# Patient Record
Sex: Male | Born: 1975
Health system: Southern US, Community
[De-identification: ages and names within clinical notes are randomized; demographics above are authoritative.]

## PROBLEM LIST (undated history)

## (undated) DIAGNOSIS — I10 Essential (primary) hypertension: Secondary | ICD-10-CM

---
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
## 2008-12-04 ENCOUNTER — Encounter: Payer: Self-pay | Admitting: Internal Medicine

## 2008-12-20 ENCOUNTER — Emergency Department (HOSPITAL_COMMUNITY): Admission: EM | Admit: 2008-12-20 | Discharge: 2008-12-20 | Payer: Self-pay | Admitting: Emergency Medicine

## 2012-03-05 ENCOUNTER — Encounter (HOSPITAL_COMMUNITY): Payer: Self-pay

## 2012-03-05 ENCOUNTER — Emergency Department (HOSPITAL_COMMUNITY)
Admission: EM | Admit: 2012-03-05 | Discharge: 2012-03-05 | Disposition: A | Discharge status: Home / Self Care | Payer: Self-pay | Source: Home / Self Care

## 2012-03-05 DIAGNOSIS — I10 Essential (primary) hypertension: Secondary | ICD-10-CM

## 2012-03-05 DIAGNOSIS — F17200 Nicotine dependence, unspecified, uncomplicated: Secondary | ICD-10-CM

## 2012-03-05 DIAGNOSIS — R079 Chest pain, unspecified: Secondary | ICD-10-CM

## 2012-03-05 HISTORY — DX: Essential (primary) hypertension: I10

## 2012-03-05 LAB — BASIC METABOLIC PANEL
CO2: 31 mEq/L (ref 19–32)
Calcium: 8.8 mg/dL (ref 8.4–10.5)
Creatinine, Ser: 1.21 mg/dL (ref 0.50–1.35)
Glucose, Bld: 82 mg/dL (ref 70–99)

## 2012-03-05 MED ORDER — HYDROCHLOROTHIAZIDE 25 MG PO TABS: 25.0000 mg | ORAL_TABLET | Freq: Every day | ORAL | Status: DC

## 2012-03-05 NOTE — ED Provider Notes (Signed)
History    CSN: [id number]  Arrival date & time 03/05/12  1104  Chief Complaint  Patient presents with  . Chest Pain    HTN  . Medication Refill   The history is provided by the patient. No language interpreter was used.   Pt reports that he has been having intermittent chest pain for the past 6 days.  The patient reports that he is worse with coughing and with taking deep breaths.  He reports no radiation.  It's in the center of his chest.  The patient reports that he is a smoker.  The patient reports that he has been having this intermittent chest pain for years.  The patient reports that he has been out of his blood pressure medication for about 2 weeks.  He reports that he was not able to continue following with his primary care provider without having medical insurance.  The patient reports that he has no nausea vomiting or acid reflux symptoms at this time.  He does report that he has frequent belching episodes.  The patient denies current headaches.  Past Medical History  Diagnosis Date  . Hypertension     Past Surgical History  Procedure Date  . Appendectomy     No family history on file.  History  Substance Use Topics  . Smoking status: Current Every Day Smoker  . Smokeless tobacco: Not on file  . Alcohol Use: No    Review of Systems  Constitutional: Negative.   HENT: Negative.   Eyes: Negative.   Respiratory: Negative.   Cardiovascular: Positive for chest pain. Negative for palpitations and leg swelling.  Gastrointestinal: Negative.   Genitourinary: Negative.   Musculoskeletal: Negative.        Cramping in feet  Neurological: Negative.   Hematological: Negative.     Allergies  Review of patient's allergies indicates no known allergies.  Home Medications   Current Outpatient Rx  Name  Route  Sig  Dispense  Refill  . HYDROCHLOROTHIAZIDE 25 MG PO TABS   Oral   Take 25 mg by mouth daily.           BP 135/107  Pulse 72  Temp 98.3 F (36.8 C)  (Oral)  Resp 19  SpO2 100%  Physical Exam  Nursing note and vitals reviewed. Constitutional: He is oriented to person, place, and time. He appears well-developed and well-nourished. No distress.  HENT:  Head: Normocephalic and atraumatic.  Eyes: EOM are normal. Pupils are equal, round, and reactive to light.  Neck: Normal range of motion. Neck supple. No JVD present. No thyromegaly present.  Cardiovascular: Normal rate, regular rhythm and normal heart sounds.   No murmur heard. Pulmonary/Chest: Effort normal and breath sounds normal. No respiratory distress. He has no wheezes. He has no rales. He exhibits tenderness.       Reproducible chest pain when palpating anterior chest wall  Abdominal: Soft. Bowel sounds are normal. He exhibits no distension.  Musculoskeletal: Normal range of motion. He exhibits no edema and no tenderness.  Lymphadenopathy:    He has no cervical adenopathy.  Neurological: He is alert and oriented to person, place, and time.  Skin: Skin is warm and dry.  Psychiatric: He has a normal mood and affect. His behavior is normal. Judgment and thought content normal.    ED Course  Procedures (including critical care time)  Labs Reviewed - No data to display No results found.  No diagnosis found.   MDM  IMPRESSION  Hypertension  Chest pain  Chronic tobacco use  Pleuritic-type chest pain  Muscle cramping, possibly from hypokalemia  cough  RECOMMENDATIONS / PLAN  I would like to check the patient's BMP to make sure he is not having hypokalemia related to hydrochlorothiazide 25 mg.  The patient says that his blood pressure was fairly stable when he's been taking it but he had poor followup with his primary care physician.  I strongly encouraged tobacco cessation.  In addition, recommended OTC NSAIDs for pleuritic-type chest pain.  Reviewed EKG.  Will follow BMP results.    FOLLOW UP One month for blood pressure recheck  The patient was given clear  instructions to go to ER or return to medical center if symptoms don't improve, worsen or new problems develop.  The patient verbalized understanding.  The patient was told to call to get lab results if they haven't heard anything in the next week.            Cleora Fleet, MD 03/05/12 1318

## 2012-03-05 NOTE — ED Notes (Signed)
Patient c/o chest pain while sitting in waiting room. Patient also states needs refill on medication

## 2012-03-06 ENCOUNTER — Telehealth (HOSPITAL_COMMUNITY): Payer: Self-pay | Admitting: Family Medicine

## 2012-03-06 NOTE — ED Notes (Signed)
Patient called today stating that he lost his RX.  Chart reviewed by Dr Lorenz Coaster.  He stated we could call the RX in for the patient.  Patient requesting RX to be called to [facility] on [address].  Message with RX left on voicemail

## 2012-03-18 ENCOUNTER — Telehealth (HOSPITAL_COMMUNITY): Payer: Self-pay

## 2012-03-18 NOTE — Progress Notes (Signed)
Quick Note:  Labs came back OK, please notify patient.   Rodney Langton, MD, CDE, FAAFP [facility] [place], [place]   ______

## 2012-03-18 NOTE — Telephone Encounter (Signed)
Message copied by Lestine Mount on Thu Mar 18, 2012  4:01 PM ------      Message from: Cleora Fleet      Created: Thu Mar 18, 2012  9:43 AM       Labs came back OK, please notify patient.             Rodney Langton, MD, CDE, FAAFP      [facility]      [facility]      [place], [place]

## 2012-03-22 NOTE — ED Notes (Signed)
Spoke with patient per dr. Gerald Stabs results ok

## 2012-05-06 ENCOUNTER — Emergency Department (HOSPITAL_COMMUNITY): Payer: No Typology Code available for payment source

## 2012-05-06 ENCOUNTER — Emergency Department (HOSPITAL_COMMUNITY)
Admission: EM | Admit: 2012-05-06 | Discharge: 2012-05-06 | Disposition: A | Discharge status: Home / Self Care | Payer: No Typology Code available for payment source | Attending: Emergency Medicine | Admitting: Emergency Medicine

## 2012-05-06 ENCOUNTER — Encounter (HOSPITAL_COMMUNITY): Payer: Self-pay | Admitting: Emergency Medicine

## 2012-05-06 DIAGNOSIS — I10 Essential (primary) hypertension: Secondary | ICD-10-CM | POA: Insufficient documentation

## 2012-05-06 DIAGNOSIS — IMO0002 Reserved for concepts with insufficient information to code with codable children: Secondary | ICD-10-CM | POA: Insufficient documentation

## 2012-05-06 DIAGNOSIS — M542 Cervicalgia: Secondary | ICD-10-CM | POA: Insufficient documentation

## 2012-05-06 DIAGNOSIS — Y9389 Activity, other specified: Secondary | ICD-10-CM | POA: Insufficient documentation

## 2012-05-06 DIAGNOSIS — F17200 Nicotine dependence, unspecified, uncomplicated: Secondary | ICD-10-CM | POA: Insufficient documentation

## 2012-05-06 DIAGNOSIS — Y92410 Unspecified street and highway as the place of occurrence of the external cause: Secondary | ICD-10-CM | POA: Insufficient documentation

## 2012-05-06 DIAGNOSIS — Z79899 Other long term (current) drug therapy: Secondary | ICD-10-CM | POA: Insufficient documentation

## 2012-05-06 DIAGNOSIS — M549 Dorsalgia, unspecified: Secondary | ICD-10-CM | POA: Insufficient documentation

## 2012-05-06 NOTE — ED Provider Notes (Signed)
Medical screening examination/treatment/procedure(s) were performed by non-physician practitioner and as supervising physician I was immediately available for consultation/collaboration.  Doug Sou, MD 05/06/12 1556
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
## 2012-05-06 NOTE — ED Notes (Signed)
Ambulated independently with steady gait and no pain to bathroom and nurses station
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
## 2012-05-06 NOTE — ED Notes (Signed)
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

## 2012-07-05 ENCOUNTER — Encounter (HOSPITAL_COMMUNITY): Payer: Self-pay | Admitting: Emergency Medicine

## 2012-07-05 ENCOUNTER — Emergency Department (INDEPENDENT_AMBULATORY_CARE_PROVIDER_SITE_OTHER)
Admission: EM | Admit: 2012-07-05 | Discharge: 2012-07-05 | Disposition: A | Discharge status: Home / Self Care | Payer: Self-pay | Source: Home / Self Care | Attending: Family Medicine | Admitting: Family Medicine

## 2012-07-05 DIAGNOSIS — Z202 Contact with and (suspected) exposure to infections with a predominantly sexual mode of transmission: Secondary | ICD-10-CM

## 2012-07-05 NOTE — ED Notes (Signed)
Extensive teaching 

## 2012-07-05 NOTE — ED Notes (Signed)
Patient concerned for std, physician evaluated patient prior to this nurse

## 2012-07-05 NOTE — ED Notes (Signed)
Patient directed to bathroom for specimen

## 2012-07-05 NOTE — ED Notes (Signed)
Patient denies symptoms, patient reports a partner called reporting she was getting treated for gc/chlamydia.reports he had intercourse with this person once and used a condom.

## 2012-07-05 NOTE — ED Notes (Signed)
Dr Artis Flock aware of vital signs.  No further orders

## 2012-07-05 NOTE — ED Provider Notes (Signed)
History     CSN: [id number]  Arrival date & time 07/05/12  1431   First MD Initiated Contact with Patient 07/05/12 1534      Chief Complaint  Patient presents with  . SEXUALLY TRANSMITTED DISEASE    (Consider location/radiation/quality/duration/timing/severity/associated sxs/prior treatment) Patient is a 37 y.o. male presenting with STD exposure. The history is provided by the patient.  Exposure to STD This is a new problem. Episode onset: told today by girlfriend that she tested pos for chlamydia and gc, pt with no sx., wants to be tested. The problem has not changed since onset.   Past Medical History  Diagnosis Date  . Hypertension     Past Surgical History  Procedure Laterality Date  . Appendectomy      No family history on file.  History  Substance Use Topics  . Smoking status: Current Every Day Smoker  . Smokeless tobacco: Not on file  . Alcohol Use: No      Review of Systems  Constitutional: Negative.   Genitourinary: Negative.     Allergies  Review of patient's allergies indicates no known allergies.  Home Medications   Current Outpatient Rx  Name  Route  Sig  Dispense  Refill  . hydrochlorothiazide (HYDRODIURIL) 25 MG tablet   Oral   Take 1 tablet (25 mg total) by mouth daily.   30 tablet   3     BP 168/106  Pulse 73  Temp(Src) 98.1 F (36.7 C) (Oral)  Resp 16  SpO2 100%  Physical Exam  Nursing note and vitals reviewed. Constitutional: He is oriented to person, place, and time. He appears well-developed and well-nourished.  Genitourinary: Penis normal. No penile tenderness. No discharge found.  Neurological: He is alert and oriented to person, place, and time.  Skin: Skin is warm and dry.    ED Course  Procedures (including critical care time)  Labs Reviewed  URINE CYTOLOGY ANCILLARY ONLY   No results found.   1. Possible exposure to STD       MDM          Linna Hoff, MD 07/05/12 [phone]

## 2012-07-15 ENCOUNTER — Emergency Department (HOSPITAL_COMMUNITY)
Admission: EM | Admit: 2012-07-15 | Discharge: 2012-07-15 | Disposition: A | Discharge status: Home / Self Care | Payer: Self-pay | Attending: Emergency Medicine | Admitting: Emergency Medicine

## 2012-07-15 ENCOUNTER — Encounter (HOSPITAL_COMMUNITY): Payer: Self-pay

## 2012-07-15 DIAGNOSIS — F17200 Nicotine dependence, unspecified, uncomplicated: Secondary | ICD-10-CM | POA: Insufficient documentation

## 2012-07-15 DIAGNOSIS — Z760 Encounter for issue of repeat prescription: Secondary | ICD-10-CM | POA: Insufficient documentation

## 2012-07-15 DIAGNOSIS — I10 Essential (primary) hypertension: Secondary | ICD-10-CM | POA: Insufficient documentation

## 2012-07-15 MED ORDER — HYDROCHLOROTHIAZIDE 25 MG PO TABS: 25.0000 mg | ORAL_TABLET | Freq: Every day | ORAL | Status: DC

## 2012-07-15 NOTE — ED Notes (Signed)
Pt. Is here for BP medication refill.  Has stopped taking his bp meds 2 months ago.  He reports that every time he smokes a cigarette he has chest pain.  He reports that his ankles will swell periodically. Pt. Denies any chest pain , sob , n/v at present time. Skin is warm and dry.

## 2012-07-15 NOTE — ED Notes (Signed)
Pt declined EKG. PA notified.

## 2012-07-15 NOTE — ED Provider Notes (Signed)
History    This chart was scribed for non-physician practitioner working with Suzi Roots, MD by Donne Anon, ED Scribe. This patient was seen in room TR04C/TR04C and the patient's care was started at 1530.   CSN: [id number]  Arrival date & time 07/15/12  1418   First MD Initiated Contact with Patient 07/15/12 1530      Chief Complaint  Patient presents with  . Medication Refill    The history is provided by the patient. No language interpreter was used.   Marc Mata is a 37 y.o. male who presents to the Emergency Department needing Hydrochlorothiazide medication refill (25 mg/day). He states he ran out of his medication 1 month ago. He normally gets it filled by [facility] (an urgent care provider), but he states he has been having problems with his orange card. He reports associated gradual onset, constant, non radiating, sharp left sided CP which lasted for 3 days which was worse when he smokes. The CP resolved itself yesterday; denies CP at this time. He further denies fever, jaw pain, vision changes, numbness, tingling, nausea, vomiting, difficulty breathing or any other pain.  Pt is a current everyday smoker but denies alcohol use.   Past Medical History  Diagnosis Date  . Hypertension     Past Surgical History  Procedure Laterality Date  . Appendectomy      No family history on file.  History  Substance Use Topics  . Smoking status: Current Every Day Smoker  . Smokeless tobacco: Not on file  . Alcohol Use: No     Review of Systems  Constitutional: Negative for fever.  Eyes: Negative for visual disturbance.  Respiratory: Negative for shortness of breath.   Cardiovascular: Negative for chest pain.  Gastrointestinal: Negative for nausea, vomiting and abdominal pain.  Skin: Negative for color change and pallor.  Neurological: Negative for dizziness, syncope, light-headedness and numbness.  All other systems reviewed and are negative.    Allergies   Review of patient's allergies indicates no known allergies.  Home Medications   Current Outpatient Rx  Name  Route  Sig  Dispense  Refill  . Multiple Vitamin (MULTIVITAMIN WITH MINERALS) TABS   Oral   Take 1 tablet by mouth daily.         . hydrochlorothiazide (HYDRODIURIL) 25 MG tablet   Oral   Take 1 tablet (25 mg total) by mouth daily.   30 tablet   1     BP 124/81  Pulse 73  Temp(Src) 98.9 F (37.2 C) (Oral)  Resp 16  SpO2 100%  Physical Exam  Nursing note and vitals reviewed. Constitutional: He is oriented to person, place, and time. He appears well-developed and well-nourished. No distress.  HENT:  Head: Normocephalic and atraumatic.  Mouth/Throat: Oropharynx is clear and moist. No oropharyngeal exudate.  Eyes: Conjunctivae and EOM are normal. Pupils are equal, round, and reactive to light. No scleral icterus.  Neck: Normal range of motion. Neck supple. No tracheal deviation present.  Cardiovascular: Normal rate, regular rhythm, normal heart sounds and intact distal pulses.   Pulmonary/Chest: Effort normal and breath sounds normal. No respiratory distress. He has no wheezes. He has no rales.  Abdominal: Soft. He exhibits no distension. There is no tenderness.  Musculoskeletal: Normal range of motion.  Lymphadenopathy:    He has no cervical adenopathy.  Neurological: He is alert and oriented to person, place, and time. He has normal reflexes.  Skin: Skin is warm and dry. No rash noted.  No erythema. No pallor.  Psychiatric: He has a normal mood and affect. His behavior is normal.    ED Course  Procedures (including critical care time) DIAGNOSTIC STUDIES: Oxygen Saturation is 100% on room air, normal by my interpretation.    COORDINATION OF CARE: 3:31 PM Discussed treatment plan which includes medication refill and EKG with pt at bedside and pt agreed to plan. Will provide pt with a PCP resource guide.  3:56 PM Pt refused EKG. Requests d/c.   Labs  Reviewed - No data to display No results found.   1. Medication refill   2. Hypertension      MDM  Patient presents for medication refill of his HCTZ as he has been out of his medication x 1 month. Patient admits to being asymptomatic at this time; on physical exam heart RRR, lungs CTA, and patient neurovascularly intact as well as hemodynamically stable. EKG recommended given spontaneously resolved, constant, aching, chest pain x 3 days which patient initially agreed to and later refused. Low suspicion for ACS as cause of patient's resolved CP given atypical nature of symptoms, however. Patient d/c with PCP follow up for further evaluation of hypertension. Indications for ED return discussed. Patient states comfort and understanding with this d/c plan with no unaddressed concerns.  I personally performed the services described in this documentation, which was scribed in my presence. The recorded information has been reviewed and is accurate.          Antony Madura, PA-C 07/17/12 2153

## 2012-07-20 NOTE — ED Provider Notes (Signed)
Medical screening examination/treatment/procedure(s) were performed by non-physician practitioner and as supervising physician I was immediately available for consultation/collaboration.   Ravis Herne E Sydna Brodowski, MD 07/20/12 1358 

## 2012-10-06 ENCOUNTER — Emergency Department (HOSPITAL_COMMUNITY)
Admission: EM | Admit: 2012-10-06 | Discharge: 2012-10-06 | Disposition: A | Discharge status: Home / Self Care | Payer: Self-pay | Attending: Emergency Medicine | Admitting: Emergency Medicine

## 2012-10-06 ENCOUNTER — Encounter (HOSPITAL_COMMUNITY): Payer: Self-pay | Admitting: *Deleted

## 2012-10-06 ENCOUNTER — Emergency Department (HOSPITAL_COMMUNITY): Payer: Self-pay

## 2012-10-06 DIAGNOSIS — R0789 Other chest pain: Secondary | ICD-10-CM | POA: Insufficient documentation

## 2012-10-06 DIAGNOSIS — I10 Essential (primary) hypertension: Secondary | ICD-10-CM

## 2012-10-06 DIAGNOSIS — M7989 Other specified soft tissue disorders: Secondary | ICD-10-CM

## 2012-10-06 DIAGNOSIS — Z79899 Other long term (current) drug therapy: Secondary | ICD-10-CM | POA: Insufficient documentation

## 2012-10-06 DIAGNOSIS — F17200 Nicotine dependence, unspecified, uncomplicated: Secondary | ICD-10-CM | POA: Insufficient documentation

## 2012-10-06 LAB — BASIC METABOLIC PANEL
BUN: 14 mg/dL (ref 6–23)
CO2: 29 mEq/L (ref 19–32)
Calcium: 8.7 mg/dL (ref 8.4–10.5)
Creatinine, Ser: 1.21 mg/dL (ref 0.50–1.35)
GFR calc Af Amer: 88 mL/min — ABNORMAL LOW (ref >90)

## 2012-10-06 LAB — CBC
MCH: 30.2 pg (ref 26.0–34.0)
MCV: 83.8 fL (ref 78.0–100.0)
Platelets: 187 10*3/uL (ref 150–400)
RDW: 13.3 % (ref 11.5–15.5)

## 2012-10-06 LAB — PRO B NATRIURETIC PEPTIDE: Pro B Natriuretic peptide (BNP): 69.1 pg/mL (ref 0–125)

## 2012-10-06 MED ORDER — HYDROCHLOROTHIAZIDE 25 MG PO TABS: 25.0000 mg | ORAL_TABLET | Freq: Every day | ORAL | Status: DC

## 2012-10-06 MED ORDER — HYDROCHLOROTHIAZIDE 25 MG PO TABS
25.0000 mg | ORAL_TABLET | Freq: Once | ORAL | Status: AC
  Administered 2012-10-06: 25 mg via ORAL
  Filled 2012-10-06: qty 1

## 2012-10-06 NOTE — ED Notes (Signed)
Meal tray and drink given per MD

## 2012-10-06 NOTE — ED Notes (Addendum)
Pt. Having dependent edema, ankle swelling for 5 days. Suppose to be on bp meds; out of bp meds for 12 days. Minor lt. Sided cp, mild sob. Swelling goes down with leg elevation. Does smoke cigs and much marijuana. Felt hands lock up..worked out today.

## 2012-10-06 NOTE — ED Notes (Signed)
Pt denies any questions or pain upon discharge. 
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
## 2012-10-30 ENCOUNTER — Emergency Department (HOSPITAL_COMMUNITY)
Admission: EM | Admit: 2012-10-30 | Discharge: 2012-10-30 | Disposition: A | Discharge status: Home / Self Care | Payer: Self-pay | Attending: Emergency Medicine | Admitting: Emergency Medicine

## 2012-10-30 ENCOUNTER — Encounter (HOSPITAL_COMMUNITY): Payer: Self-pay | Admitting: *Deleted

## 2012-10-30 DIAGNOSIS — F17200 Nicotine dependence, unspecified, uncomplicated: Secondary | ICD-10-CM | POA: Insufficient documentation

## 2012-10-30 DIAGNOSIS — I10 Essential (primary) hypertension: Secondary | ICD-10-CM | POA: Insufficient documentation

## 2012-10-30 DIAGNOSIS — H9209 Otalgia, unspecified ear: Secondary | ICD-10-CM | POA: Insufficient documentation

## 2012-10-30 DIAGNOSIS — K0889 Other specified disorders of teeth and supporting structures: Secondary | ICD-10-CM

## 2012-10-30 DIAGNOSIS — K089 Disorder of teeth and supporting structures, unspecified: Secondary | ICD-10-CM | POA: Insufficient documentation

## 2012-10-30 MED ORDER — HYDROCODONE-ACETAMINOPHEN 5-325 MG PO TABS
2.0000 | ORAL_TABLET | Freq: Once | ORAL | Status: AC
  Administered 2012-10-30: 2 via ORAL
  Filled 2012-10-30: qty 2

## 2012-10-30 MED ORDER — HYDROCODONE-ACETAMINOPHEN 5-325 MG PO TABS: 2.0000 | ORAL_TABLET | Freq: Four times a day (QID) | ORAL | Status: DC | PRN

## 2012-10-30 MED ORDER — PENICILLIN V POTASSIUM 500 MG PO TABS: 500.0000 mg | ORAL_TABLET | Freq: Four times a day (QID) | ORAL | Status: DC

## 2012-10-30 NOTE — Progress Notes (Signed)
Met Patient at bedside,role of case manager explained to patient.Patient reports his understanding.Patient verbalizes he has increased dental pain and that he does not have a PCP.Patient  Provided with resource sheet for [place]dental department,and also a resource sheet for the [facility] on [address].Patient provided with resources for the  [place] [facility] work- orange card patient reports he will follow up next week.No further case management needs identified.

## 2012-10-30 NOTE — ED Provider Notes (Signed)
History  This chart was scribed for non-physician practitioner, Felipa Furnace, working with Gavin Pound. Oletta Lamas, MD by Ardeen Jourdain, ED Scribe. This patient was seen in room TR05C/TR05C and the patient's care was started at 1556.  CSN: [id number]     Arrival date & time 10/30/12  1528   First MD Initiated Contact with Patient 10/30/12 1556     Chief Complaint  Patient presents with  . Dental Pain    The history is provided by the patient. No language interpreter was used.    HPI Comments: Marc Mata is a 37 y.o. male who presents to the Emergency Department complaining of gradual onset, gradually worsening, constant dental pain from multiple teeth with associated ear pain. He states the pain began a few months ago. He denies taking anything for the pain. He states cold and heat aggravate the pain. He denies seeing a dentist for the symptoms.    Past Medical History  Diagnosis Date  . Hypertension    Past Surgical History  Procedure Laterality Date  . Appendectomy     History reviewed. No pertinent family history. History  Substance Use Topics  . Smoking status: Current Every Day Smoker  . Smokeless tobacco: Not on file  . Alcohol Use: No    Review of Systems  HENT: Positive for dental problem.   All other systems reviewed and are negative.    Allergies  Review of patient's allergies indicates no known allergies.  Home Medications   Current Outpatient Rx  Name  Route  Sig  Dispense  Refill  . hydrochlorothiazide (HYDRODIURIL) 25 MG tablet   Oral   Take 25 mg by mouth daily.         Marland Kitchen HYDROcodone-acetaminophen (NORCO/VICODIN) 5-325 MG per tablet   Oral   Take 2 tablets by mouth every 6 (six) hours as needed for pain.   15 tablet   0   . penicillin v potassium (VEETID) 500 MG tablet   Oral   Take 1 tablet (500 mg total) by mouth 4 (four) times daily.   40 tablet   0    Triage Vitals: BP 137/79  Pulse 76  Temp(Src) 98.6 F (37 C) (Oral)  Resp  20  SpO2 96%  Physical Exam  Nursing note and vitals reviewed. Constitutional: He is oriented to person, place, and time. He appears well-developed and well-nourished. No distress.  HENT:  Head: Normocephalic and atraumatic.  Right Ear: Tympanic membrane, external ear and ear canal normal.  Left Ear: Tympanic membrane, external ear and ear canal normal.  Mouth/Throat:    Poor dentition throughout.  Affected tooth as diagrammed.  No signs of peritonsillar or tonsillar abscess.  No signs of gingival abscess. Oropharynx is clear and without exudates.  Uvula is midline.  Airway is intact. No signs of Ludwig's angina.   Eyes: EOM are normal.  Neck: Neck supple. No tracheal deviation present.  Cardiovascular: Normal rate.   Pulmonary/Chest: Effort normal. No respiratory distress.  Musculoskeletal: Normal range of motion.  Neurological: He is alert and oriented to person, place, and time.  Skin: Skin is warm and dry.  Psychiatric: He has a normal mood and affect. His behavior is normal.    ED Course   Procedures (including critical care time)  DIAGNOSTIC STUDIES: Oxygen Saturation is 96% on room air, adequate by my interpretation.    COORDINATION OF CARE:  4:50 PM-Discussed treatment plan which includes antibiotics, pain medication and follow up with a dentist with pt at  bedside and pt agreed to plan.     Labs Reviewed - No data to display No results found. 1. Pain, dental     MDM  Patient with toothache.  No gross abscess.  Exam unconcerning for Ludwig's angina or spread of infection.  Will treat with penicillin and pain medicine.  Urged patient to follow-up with dentist.      I personally performed the services described in this documentation, which was scribed in my presence. The recorded information has been reviewed and is accurate.     Roxy Horseman, PA-C 10/30/12 1704

## 2012-10-30 NOTE — ED Notes (Signed)
Pt c/o upper right dental pain. States now he has pain up into right ear. States he doesn't remember how long he has had this pain.

## 2012-10-30 NOTE — ED Provider Notes (Signed)
Medical screening examination/treatment/procedure(s) were performed by non-physician practitioner and as supervising physician I was immediately available for consultation/collaboration.   Dannon Perlow Y. Raphaella Larkin, MD 10/30/12 2327 

## 2012-10-30 NOTE — ED Notes (Signed)
Pt reports multiple teeth that are hurting him. Airway intact.

## 2012-12-05 ENCOUNTER — Encounter (HOSPITAL_COMMUNITY): Payer: Self-pay | Admitting: Emergency Medicine

## 2012-12-05 ENCOUNTER — Emergency Department (HOSPITAL_COMMUNITY)
Admission: EM | Admit: 2012-12-05 | Discharge: 2012-12-05 | Disposition: A | Discharge status: Home / Self Care | Payer: Self-pay | Attending: Emergency Medicine | Admitting: Emergency Medicine

## 2012-12-05 DIAGNOSIS — Z760 Encounter for issue of repeat prescription: Secondary | ICD-10-CM | POA: Insufficient documentation

## 2012-12-05 DIAGNOSIS — Z79899 Other long term (current) drug therapy: Secondary | ICD-10-CM | POA: Insufficient documentation

## 2012-12-05 DIAGNOSIS — I10 Essential (primary) hypertension: Secondary | ICD-10-CM | POA: Insufficient documentation

## 2012-12-05 DIAGNOSIS — F17200 Nicotine dependence, unspecified, uncomplicated: Secondary | ICD-10-CM | POA: Insufficient documentation

## 2012-12-05 MED ORDER — HYDROCHLOROTHIAZIDE 25 MG PO TABS: 25.0000 mg | ORAL_TABLET | Freq: Every day | ORAL | Status: DC

## 2012-12-05 NOTE — ED Provider Notes (Signed)
Medical screening examination/treatment/procedure(s) were performed by non-physician practitioner and as supervising physician I was immediately available for consultation/collaboration.   Audree Camel, MD 12/05/12 [phone]

## 2012-12-05 NOTE — ED Provider Notes (Signed)
CSN: [id number]     Arrival date & time 12/05/12  1114 History   First MD Initiated Contact with Patient 12/05/12 1346     Chief Complaint  Patient presents with  . Medication Refill   (Consider location/radiation/quality/duration/timing/severity/associated sxs/prior Treatment) HPI Comments: Patient present for refill of his hydrochlorothiazide. States he has been out of that for one month. States he is working very hard to try to find a primary care provider and get approved for the [employer]. Denies any physical complaints. States he eats well and takes care of himself. Specifically denies chest pain, shortness of breath.  The history is provided by the patient.    Past Medical History  Diagnosis Date  . Hypertension    Past Surgical History  Procedure Laterality Date  . Appendectomy     No family history on file. History  Substance Use Topics  . Smoking status: Current Every Day Smoker  . Smokeless tobacco: Not on file  . Alcohol Use: No    Review of Systems  Respiratory: Negative for shortness of breath.   Cardiovascular: Negative for chest pain.  Musculoskeletal: Negative for back pain.  Neurological: Negative for weakness and numbness.    Allergies  Review of patient's allergies indicates no known allergies.  Home Medications   Current Outpatient Rx  Name  Route  Sig  Dispense  Refill  . hydrochlorothiazide (HYDRODIURIL) 25 MG tablet   Oral   Take 25 mg by mouth daily.         Marland Kitchen HYDROcodone-acetaminophen (NORCO/VICODIN) 5-325 MG per tablet   Oral   Take 2 tablets by mouth every 6 (six) hours as needed for pain.   15 tablet   0   . hydrochlorothiazide (HYDRODIURIL) 25 MG tablet   Oral   Take 1 tablet (25 mg total) by mouth daily.   30 tablet   1    BP 153/95  Pulse 85  Temp(Src) 98.6 F (37 C) (Oral)  Resp 18  Ht 5\' 9"  (1.753 m)  Wt 180 lb (81.647 kg)  BMI 26.57 kg/m2  SpO2 96% Physical Exam  Nursing note and vitals  reviewed. Constitutional: He appears well-developed and well-nourished. No distress.  HENT:  Head: Normocephalic and atraumatic.  Neck: Neck supple.  Pulmonary/Chest: Effort normal.  Neurological: He is alert.  Skin: He is not diaphoretic.    ED Course  Procedures (including critical care time) Labs Review Labs Reviewed - No data to display Imaging Review No results found.  MDM   1. Medication refill   2. Hypertension    Patient consents only for medication refill. He is completely asymptomatic. Discharged home with hydrochlorothiazide her previous prescription. PCP followup. Return precautions given. Patient verbalizes understanding and agrees with plan    Trixie Dredge, PA-C 12/05/12 1412

## 2012-12-05 NOTE — ED Notes (Signed)
Patient called 3x, no answer.

## 2012-12-05 NOTE — ED Notes (Signed)
Pt request med refill for HCTZ. Pt has not taken his medication x 31 days.

## 2012-12-05 NOTE — ED Notes (Signed)
Pt stated that he has been out of his HCTZ for over 30days and needs a refill.

## 2013-03-05 ENCOUNTER — Encounter (HOSPITAL_COMMUNITY): Payer: Self-pay | Admitting: Emergency Medicine

## 2013-03-05 ENCOUNTER — Emergency Department (HOSPITAL_COMMUNITY): Payer: Self-pay

## 2013-03-05 ENCOUNTER — Emergency Department (HOSPITAL_COMMUNITY)
Admission: EM | Admit: 2013-03-05 | Discharge: 2013-03-05 | Disposition: A | Discharge status: Home / Self Care | Payer: Self-pay | Attending: Emergency Medicine | Admitting: Emergency Medicine

## 2013-03-05 DIAGNOSIS — M25473 Effusion, unspecified ankle: Secondary | ICD-10-CM | POA: Insufficient documentation

## 2013-03-05 DIAGNOSIS — R51 Headache: Secondary | ICD-10-CM | POA: Insufficient documentation

## 2013-03-05 DIAGNOSIS — I10 Essential (primary) hypertension: Secondary | ICD-10-CM | POA: Insufficient documentation

## 2013-03-05 DIAGNOSIS — F17200 Nicotine dependence, unspecified, uncomplicated: Secondary | ICD-10-CM | POA: Insufficient documentation

## 2013-03-05 DIAGNOSIS — Z760 Encounter for issue of repeat prescription: Secondary | ICD-10-CM | POA: Insufficient documentation

## 2013-03-05 DIAGNOSIS — M25476 Effusion, unspecified foot: Secondary | ICD-10-CM | POA: Insufficient documentation

## 2013-03-05 DIAGNOSIS — Z79899 Other long term (current) drug therapy: Secondary | ICD-10-CM | POA: Insufficient documentation

## 2013-03-05 DIAGNOSIS — R0789 Other chest pain: Secondary | ICD-10-CM | POA: Insufficient documentation

## 2013-03-05 DIAGNOSIS — H538 Other visual disturbances: Secondary | ICD-10-CM | POA: Insufficient documentation

## 2013-03-05 LAB — BASIC METABOLIC PANEL
BUN: 9 mg/dL (ref 6–23)
CO2: 29 mEq/L (ref 19–32)
Calcium: 8.7 mg/dL (ref 8.4–10.5)
GFR calc Af Amer: >90 mL/min (ref >90)
Glucose, Bld: 90 mg/dL (ref 70–99)
Potassium: 4.3 mEq/L (ref 3.5–5.1)
Sodium: 140 mEq/L (ref 135–145)

## 2013-03-05 LAB — POCT I-STAT TROPONIN I

## 2013-03-05 LAB — CBC
HCT: 42.8 % (ref 39.0–52.0)
Hemoglobin: 15.6 g/dL (ref 13.0–17.0)
MCH: 30.7 pg (ref 26.0–34.0)
MCHC: 36.4 g/dL — ABNORMAL HIGH (ref 30.0–36.0)
RBC: 5.08 MIL/uL (ref 4.22–5.81)

## 2013-03-05 LAB — PRO B NATRIURETIC PEPTIDE: Pro B Natriuretic peptide (BNP): 77.9 pg/mL (ref 0–125)

## 2013-03-05 MED ORDER — ASPIRIN 325 MG PO TABS
325.0000 mg | ORAL_TABLET | Freq: Once | ORAL | Status: AC
  Administered 2013-03-05: 325 mg via ORAL
  Filled 2013-03-05: qty 1

## 2013-03-05 MED ORDER — HYDROCHLOROTHIAZIDE 25 MG PO TABS: 25.0000 mg | ORAL_TABLET | Freq: Every day | ORAL | Status: DC

## 2013-03-05 MED ORDER — HYDROCHLOROTHIAZIDE 25 MG PO TABS
25.0000 mg | ORAL_TABLET | Freq: Every day | ORAL | Status: DC
  Administered 2013-03-05: 25 mg via ORAL
  Filled 2013-03-05: qty 1

## 2013-03-05 NOTE — ED Notes (Signed)
Pt blood pressure remains elevated. Caring PA notified that his BP is still high. PA  is aware of HTN and stated that she will discharge patient. Pt stated that he was going to leave AMA and doctor is aware. Provided pt with discharge instructions. Also, notified pt when  To come back to hospital and s/s of HTN. Pt stated that he is aware.

## 2013-03-05 NOTE — ED Notes (Signed)
Provided member with specimen cup for urine sample. Pt went to the bathroom and came back and stated that he forgot that we needed a sample and did not give [place] a sample. Notified PA.

## 2013-03-05 NOTE — ED Notes (Signed)
Pt refused urine sample. PA made aware and stated that is okay and not needed at this time. PA Okayed to get pt something to eat and drink. Provided pt with [place] sandwich and water.

## 2013-03-05 NOTE — ED Notes (Addendum)
PA at bedside. PA aware of elevated blood pressure.

## 2013-03-05 NOTE — ED Notes (Signed)
Pt stated that he came to the ED because he has been having Chest pain x 4 days. Pain is on both left and right chest. Pain feels like a aching pain. Pain is intermittent. Pt stated that he was SOB about 2 days ago. He stated that SOB went away within 30 minutes- 1 hour. Pt stated that he thinks the pain is related to stress. He stated that he has been extremely stressed lately because of relationship and financial issues. No cardiac or respiratory distress. Will continue to monitor.

## 2013-03-05 NOTE — ED Notes (Signed)
Pt states originally that he is here for medication refill on BP meds and ankle swelling.  Then sits down and states his chest just started hurting and he is a heavy pot smoker
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
## 2013-03-06 NOTE — ED Provider Notes (Signed)
Medical screening examination/treatment/procedure(s) were performed by non-physician practitioner and as supervising physician I was immediately available for consultation/collaboration.  EKG Interpretation    Date/Time:  Saturday March 05 2013 08:56:05 EST Ventricular Rate:  85 PR Interval:  154 QRS Duration: 82 QT Interval:  352 QTC Calculation: 418 R Axis:   82 Text Interpretation:  Normal sinus rhythm Septal infarct , age undetermined No significant change since last tracing Confirmed by [facility]  MD, Toren Tucholski [phone]) on 03/05/2013 11:48:59 AM             Hurman Horn, MD 03/06/13 [phone]

## 2013-03-20 ENCOUNTER — Encounter (HOSPITAL_COMMUNITY): Payer: Self-pay | Admitting: Emergency Medicine

## 2013-03-20 ENCOUNTER — Emergency Department (HOSPITAL_COMMUNITY)
Admission: EM | Admit: 2013-03-20 | Discharge: 2013-03-20 | Disposition: A | Discharge status: Home / Self Care | Payer: Self-pay | Attending: Emergency Medicine | Admitting: Emergency Medicine

## 2013-03-20 DIAGNOSIS — F17200 Nicotine dependence, unspecified, uncomplicated: Secondary | ICD-10-CM | POA: Insufficient documentation

## 2013-03-20 DIAGNOSIS — R300 Dysuria: Secondary | ICD-10-CM | POA: Insufficient documentation

## 2013-03-20 DIAGNOSIS — Z79899 Other long term (current) drug therapy: Secondary | ICD-10-CM | POA: Insufficient documentation

## 2013-03-20 DIAGNOSIS — I10 Essential (primary) hypertension: Secondary | ICD-10-CM | POA: Insufficient documentation

## 2013-03-20 LAB — URINALYSIS, ROUTINE W REFLEX MICROSCOPIC
Ketones, ur: NEGATIVE mg/dL
Nitrite: NEGATIVE
Protein, ur: >300 mg/dL — AB
Specific Gravity, Urine: 1.023 (ref 1.005–1.030)
Urobilinogen, UA: 1 mg/dL (ref 0.0–1.0)

## 2013-03-20 LAB — URINE MICROSCOPIC-ADD ON

## 2013-03-20 MED ORDER — CIPROFLOXACIN HCL 500 MG PO TABS: 500.0000 mg | ORAL_TABLET | Freq: Two times a day (BID) | ORAL | Status: DC

## 2013-03-20 NOTE — ED Notes (Signed)
Social service consult.

## 2013-03-20 NOTE — ED Provider Notes (Signed)
CSN: [id number]     Arrival date & time 03/20/13  6295 History   First MD Initiated Contact with Patient 03/20/13 0945    This chart was scribed for Irish Elders NP, a non-physician practitioner working with Junius Argyle, MD by Lewanda Rife, ED Scribe. This patient was seen in room TR07C/TR07C and the patient's care was started at 10:08 AM     Chief Complaint  Patient presents with  . Urinary Tract Infection   (Consider location/radiation/quality/duration/timing/severity/associated sxs/prior Treatment) The history is provided by the patient. No language interpreter was used.   HPI Comments: Marc Mata is a 37 y.o. male who presents to the Emergency Department complaining of worsening intermittent dysuria onset 2 days. Reports associated red discharge noted after urination. Denies any aggravating or alleviating factors. Denies associated fever, chills, nausea, emesis, diarrhea, back pain, and abdominal pain. Denies new sex partners, and unprotected sex. Denies PMHx of STI.   Past Medical History  Diagnosis Date  . Hypertension    Past Surgical History  Procedure Laterality Date  . Appendectomy     No family history on file. History  Substance Use Topics  . Smoking status: Current Every Day Smoker  . Smokeless tobacco: Not on file  . Alcohol Use: Yes    Review of Systems  Genitourinary: Positive for dysuria.  Psychiatric/Behavioral: Negative for confusion.    Allergies  Review of patient's allergies indicates no known allergies.  Home Medications   Current Outpatient Rx  Name  Route  Sig  Dispense  Refill  . hydrochlorothiazide (HYDRODIURIL) 25 MG tablet   Oral   Take 1 tablet (25 mg total) by mouth daily.   30 tablet   0    BP 137/82  Pulse 93  Temp(Src) 98.8 F (37.1 C) (Oral)  Resp 18  Ht 5\' 9"  (1.753 m)  Wt 178 lb (80.74 kg)  BMI 26.27 kg/m2  SpO2 99% Physical Exam  Nursing note and vitals reviewed. Constitutional: He is oriented to person,  place, and time. He appears well-developed and well-nourished. No distress.  HENT:  Head: Normocephalic and atraumatic.  Eyes: EOM are normal.  Neck: Neck supple. No tracheal deviation present.  Cardiovascular: Normal rate and regular rhythm.   No murmur heard. Pulmonary/Chest: Effort normal and breath sounds normal. No respiratory distress. He has no wheezes. He has no rales.  Abdominal: Soft. There is no tenderness.  No CVA tenderness  Musculoskeletal: Normal range of motion.  Neurological: He is alert and oriented to person, place, and time.  Skin: Skin is warm and dry.  Psychiatric: He has a normal mood and affect. His behavior is normal.    ED Course  Procedures (including critical care time)  COORDINATION OF CARE:  Nursing notes reviewed. Vital signs reviewed. Initial pt interview and examination performed.   9:52 AM-Discussed work up plan with pt at bedside, which includes UA, GC/Chlamydia probe, RPR, and HIV antibody. Pt agrees with plan.  10:33 AM Pt declined blood draw for RPR and HIV antibody. Pt informed of risks and recommendations.   Treatment plan initiated:Medications - No data to display   Initial diagnostic testing ordered.    Labs Review Labs Reviewed  URINALYSIS, ROUTINE W REFLEX MICROSCOPIC - Abnormal; Notable for the following:    APPearance CLOUDY (*)    Hgb urine dipstick SMALL (*)    Protein, ur >300 (*)    Leukocytes, UA SMALL (*)    All other components within normal limits  GC/CHLAMYDIA PROBE AMP  URINE MICROSCOPIC-ADD ON   Imaging Review No results found.  EKG Interpretation   None       MDM   1. Dysuria    Afebrile. Non-toxic in appearance. Pt reports that he is homeless and that he doesn't wish to talk to anyone about help, social worker consult offered. Prescription for Cipro, reports that he has the ability to get prescription. Urine cloudy with leuks, will treat. Sent GC/Chlamydia cultures. Pt agrees to return if symptoms  worsen. No hematuria, back pain, chills or fever. Pt understands and agrees to plan.  I personally performed the services described in this documentation, which was scribed in my presence. The recorded information has been reviewed and is accurate.    Irish Elders, NP 03/25/13 [phone]

## 2013-03-20 NOTE — ED Notes (Signed)
Pt refused labdraw

## 2013-03-20 NOTE — Progress Notes (Signed)
Weekend CSW received call to speak with patient to assess for social needs. When CSW arrived, RN informed CSW that patient did not want to speak with CSW and had left hospital. Please re-consult if further social work needs arise.   Samuella Bruin, MSW, LCSWA Clinical Social Worker [facility] Emergency Dept. [phone]

## 2013-03-20 NOTE — ED Notes (Signed)
Pt had questions about additional resources on discharge, social work paged. Pt left prior to social work but did receive full discharge instructions.

## 2013-03-20 NOTE — ED Notes (Signed)
Pt refused social service consult

## 2013-03-20 NOTE — ED Notes (Signed)
Pt c/o burning with urination and red discharge from penis when wiping after urination.

## 2013-03-21 LAB — GC/CHLAMYDIA PROBE AMP
CT Probe RNA: NEGATIVE
GC Probe RNA: NEGATIVE

## 2013-03-29 NOTE — ED Provider Notes (Signed)
Medical screening examination/treatment/procedure(s) were performed by non-physician practitioner and as supervising physician I was immediately available for consultation/collaboration.  EKG Interpretation   None         Zamyah Wiesman S Chelcea Zahn, MD 03/29/13 1135 

## 2013-04-25 ENCOUNTER — Emergency Department (HOSPITAL_COMMUNITY)
Admission: EM | Admit: 2013-04-25 | Discharge: 2013-04-25 | Disposition: A | Discharge status: Home / Self Care | Payer: Self-pay | Attending: Emergency Medicine | Admitting: Emergency Medicine

## 2013-04-25 DIAGNOSIS — Z79899 Other long term (current) drug therapy: Secondary | ICD-10-CM | POA: Insufficient documentation

## 2013-04-25 DIAGNOSIS — Z5900 Homelessness unspecified: Secondary | ICD-10-CM | POA: Insufficient documentation

## 2013-04-25 DIAGNOSIS — Z792 Long term (current) use of antibiotics: Secondary | ICD-10-CM | POA: Insufficient documentation

## 2013-04-25 DIAGNOSIS — F1410 Cocaine abuse, uncomplicated: Secondary | ICD-10-CM | POA: Insufficient documentation

## 2013-04-25 DIAGNOSIS — I10 Essential (primary) hypertension: Secondary | ICD-10-CM | POA: Insufficient documentation

## 2013-04-25 DIAGNOSIS — F17200 Nicotine dependence, unspecified, uncomplicated: Secondary | ICD-10-CM | POA: Insufficient documentation

## 2013-04-25 LAB — CBC
HCT: 37.7 % — ABNORMAL LOW (ref 39.0–52.0)
HEMOGLOBIN: 13.5 g/dL (ref 13.0–17.0)
MCH: 30.2 pg (ref 26.0–34.0)
MCHC: 35.8 g/dL (ref 30.0–36.0)
MCV: 84.3 fL (ref 78.0–100.0)
Platelets: 134 10*3/uL — ABNORMAL LOW (ref 150–400)
RBC: 4.47 MIL/uL (ref 4.22–5.81)
RDW: 12.7 % (ref 11.5–15.5)
WBC: 4.6 10*3/uL (ref 4.0–10.5)

## 2013-04-25 LAB — BASIC METABOLIC PANEL
BUN: 11 mg/dL (ref 6–23)
CO2: 28 mEq/L (ref 19–32)
Calcium: 7.9 mg/dL — ABNORMAL LOW (ref 8.4–10.5)
Chloride: 104 mEq/L (ref 96–112)
Creatinine, Ser: 1.03 mg/dL (ref 0.50–1.35)
Glucose, Bld: 115 mg/dL — ABNORMAL HIGH (ref 70–99)
Potassium: 4.5 mEq/L (ref 3.7–5.3)
Sodium: 140 mEq/L (ref 137–147)

## 2013-04-25 MED ORDER — HYDRALAZINE HCL 20 MG/ML IJ SOLN
5.0000 mg | Freq: Once | INTRAMUSCULAR | Status: AC
  Administered 2013-04-25: 5 mg via INTRAVENOUS
  Filled 2013-04-25: qty 1

## 2013-04-25 MED ORDER — LABETALOL HCL 5 MG/ML IV SOLN
20.0000 mg | Freq: Once | INTRAVENOUS | Status: AC
  Administered 2013-04-25: 20 mg via INTRAVENOUS
  Filled 2013-04-25: qty 4

## 2013-04-25 MED ORDER — HYDROCHLOROTHIAZIDE 25 MG PO TABS: 25.0000 mg | ORAL_TABLET | Freq: Every day | ORAL | Status: DC

## 2013-04-25 MED ORDER — HYDROCHLOROTHIAZIDE 25 MG PO TABS
25.0000 mg | ORAL_TABLET | ORAL | Status: AC
  Administered 2013-04-25: 25 mg via ORAL
  Filled 2013-04-25: qty 1

## 2013-04-25 MED ORDER — CLONIDINE HCL 0.2 MG PO TABS
0.2000 mg | ORAL_TABLET | Freq: Once | ORAL | Status: AC
  Administered 2013-04-25: 0.2 mg via ORAL
  Filled 2013-04-25: qty 1

## 2013-04-25 NOTE — ED Notes (Signed)
Pt here from home with c/o headache and htn , pt has been out of his meds for 3 months

## 2013-04-25 NOTE — ED Notes (Signed)
Pt now states that his head does not hurt just here to get something for his htn

## 2013-04-25 NOTE — ED Notes (Signed)
Pt. oob to the bathroom gait steady 

## 2013-04-25 NOTE — ED Provider Notes (Signed)
7:36 AM Accepted care from Dr. Lavella LemonsManly. 43M here for BP check. Reported transient HA yesterday, asx today here w/ elevated BP. Labetalol given w/out relief. Clonidine then given.   8:58 AM: BP decreasing appropriately. Pt states he can get the BP meds and will f/u. He remains asx.  I have discussed the diagnosis/risks/treatment options with the patient and believe the pt to be eligible for discharge home to follow-up with and estab w/ a pcp. We also discussed returning to the ED immediately if new or worsening sx occur. We discussed the sx which are most concerning (e.g., HA, blurry vision, dizziness, cp, sob) that necessitate immediate return. Any new prescriptions provided to the patient are listed below.  New Prescriptions   HYDROCHLOROTHIAZIDE (HYDRODIURIL) 25 MG TABLET    Take 1 tablet (25 mg total) by mouth daily.   Clinical Impression 1. Hypertension   2. Cocaine abuse      Junius ArgyleForrest S Avonna Iribe, MD 04/25/13 [phone]

## 2013-04-25 NOTE — Discharge Instructions (Signed)
Arterial Hypertension °Arterial hypertension (high blood pressure) is a condition of elevated pressure in your blood vessels. Hypertension over a long period of time is a risk factor for strokes, heart attacks, and heart failure. It is also the leading cause of kidney (renal) failure.  °CAUSES  °· In Adults -- Over 90% of all hypertension has no known cause. This is called essential or primary hypertension. In the other 10% of people with hypertension, the increase in blood pressure is caused by another disorder. This is called secondary hypertension. Important causes of secondary hypertension are: °· Heavy alcohol use. °· Obstructive sleep apnea. °· Hyperaldosterosim (Conn's syndrome). °· Steroid use. °· Chronic kidney failure. °· Hyperparathyroidism. °· Medications. °· Renal artery stenosis. °· Pheochromocytoma. °· Cushing's disease. °· Coarctation of the aorta. °· Scleroderma renal crisis. °· Licorice (in excessive amounts). °· Drugs (cocaine, methamphetamine). °Your caregiver can explain any items above that apply to you. °· In Children -- Secondary hypertension is more common and should always be considered. °· Pregnancy -- Few women of childbearing age have high blood pressure. However, up to 10% of them develop hypertension of pregnancy. Generally, this will not harm the woman. It may be a sign of 3 complications of pregnancy: preeclampsia, HELLP syndrome, and eclampsia. Follow up and control with medication is necessary. °SYMPTOMS  °· This condition normally does not produce any noticeable symptoms. It is usually found during a routine exam. °· Malignant hypertension is a late problem of high blood pressure. It may have the following symptoms: °· Headaches. °· Blurred vision. °· End-organ damage (this means your kidneys, heart, lungs, and other organs are being damaged). °· Stressful situations can increase the blood pressure. If a person with normal blood pressure has their blood pressure go up while being  seen by their caregiver, this is often termed "white coat hypertension." Its importance is not known. It may be related with eventually developing hypertension or complications of hypertension. °· Hypertension is often confused with mental tension, stress, and anxiety. °DIAGNOSIS  °The diagnosis is made by 3 separate blood pressure measurements. They are taken at least 1 week apart from each other. If there is organ damage from hypertension, the diagnosis may be made without repeat measurements. °Hypertension is usually identified by having blood pressure readings: °· Above 140/90 mmHg measured in both arms, at 3 separate times, over a couple weeks. °· Over 130/80 mmHg should be considered a risk factor and may require treatment in patients with diabetes. °Blood pressure readings over 120/80 mmHg are called "pre-hypertension" even in non-diabetic patients. °To get a true blood pressure measurement, use the following guidelines. Be aware of the factors that can alter blood pressure readings. °· Take measurements at least 1 hour after caffeine. °· Take measurements 30 minutes after smoking and without any stress. This is another reason to quit smoking  it raises your blood pressure. °· Use a proper cuff size. Ask your caregiver if you are not sure about your cuff size. °· Most home blood pressure cuffs are automatic. They will measure systolic and diastolic pressures. The systolic pressure is the pressure reading at the start of sounds. Diastolic pressure is the pressure at which the sounds disappear. If you are elderly, measure pressures in multiple postures. Try sitting, lying or standing. °· Sit at rest for a minimum of 5 minutes before taking measurements. °· You should not be on any medications like decongestants. These are found in many cold medications. °· Record your blood pressure readings and review   them with your caregiver. °If you have hypertension: °· Your caregiver may do tests to be sure you do not have  secondary hypertension (see "causes" above). °· Your caregiver may also look for signs of metabolic syndrome. This is also called Syndrome X or Insulin Resistance Syndrome. You may have this syndrome if you have type 2 diabetes, abdominal obesity, and abnormal blood lipids in addition to hypertension. °· Your caregiver will take your medical and family history and perform a physical exam. °· Diagnostic tests may include blood tests (for glucose, cholesterol, potassium, and kidney function), a urinalysis, or an EKG. Other tests may also be necessary depending on your condition. °PREVENTION  °There are important lifestyle issues that you can adopt to reduce your chance of developing hypertension: °· Maintain a normal weight. °· Limit the amount of salt (sodium) in your diet. °· Exercise often. °· Limit alcohol intake. °· Get enough potassium in your diet. Discuss specific advice with your caregiver. °· Follow a DASH diet (dietary approaches to stop hypertension). This diet is rich in fruits, vegetables, and low-fat dairy products, and avoids certain fats. °PROGNOSIS  °Essential hypertension cannot be cured. Lifestyle changes and medical treatment can lower blood pressure and reduce complications. The prognosis of secondary hypertension depends on the underlying cause. Many people whose hypertension is controlled with medicine or lifestyle changes can live a normal, healthy life.  °RISKS AND COMPLICATIONS  °While high blood pressure alone is not an illness, it often requires treatment due to its short- and long-term effects on many organs. Hypertension increases your risk for: °· CVAs or strokes (cerebrovascular accident). °· Heart failure due to chronically high blood pressure (hypertensive cardiomyopathy). °· Heart attack (myocardial infarction). °· Damage to the retina (hypertensive retinopathy). °· Kidney failure (hypertensive nephropathy). °Your caregiver can explain list items above that apply to you. Treatment  of hypertension can significantly reduce the risk of complications. °TREATMENT  °· For overweight patients, weight loss and regular exercise are recommended. Physical fitness lowers blood pressure. °· Mild hypertension is usually treated with diet and exercise. A diet rich in fruits and vegetables, fat-free dairy products, and foods low in fat and salt (sodium) can help lower blood pressure. Decreasing salt intake decreases blood pressure in a 1/3 of people. °· Stop smoking if you are a smoker. °The steps above are highly effective in reducing blood pressure. While these actions are easy to suggest, they are difficult to achieve. Most patients with moderate or severe hypertension end up requiring medications to bring their blood pressure down to a normal level. There are several classes of medications for treatment. Blood pressure pills (antihypertensives) will lower blood pressure by their different actions. Lowering the blood pressure by 10 mmHg may decrease the risk of complications by as much as 25%. °The goal of treatment is effective blood pressure control. This will reduce your risk for complications. Your caregiver will help you determine the best treatment for you according to your lifestyle. What is excellent treatment for one person, may not be for you. °HOME CARE INSTRUCTIONS  °· Do not smoke. °· Follow the lifestyle changes outlined in the "Prevention" section. °· If you are on medications, follow the directions carefully. Blood pressure medications must be taken as prescribed. Skipping doses reduces their benefit. It also puts you at risk for problems. °· Follow up with your caregiver, as directed. °· If you are asked to monitor your blood pressure at home, follow the guidelines in the "Diagnosis" section above. °SEEK MEDICAL CARE   IF:  °· You think you are having medication side effects. °· You have recurrent headaches or lightheadedness. °· You have swelling in your ankles. °· You have trouble with  your vision. °SEEK IMMEDIATE MEDICAL CARE IF:  °· You have sudden onset of chest pain or pressure, difficulty breathing, or other symptoms of a heart attack. °· You have a severe headache. °· You have symptoms of a stroke (such as sudden weakness, difficulty speaking, difficulty walking). °MAKE SURE YOU:  °· Understand these instructions. °· Will watch your condition. °· Will get help right away if you are not doing well or get worse. °Document Released: 03/24/2005 Document Revised: 06/16/2011 Document Reviewed: 10/22/2006 °Exit[employer]® Patient Information ©2014 [employer]. ° °

## 2013-04-25 NOTE — ED Provider Notes (Addendum)
CSN: [id number]     Arrival date & time 04/25/13  [id number] History   First MD Initiated Contact with Patient 04/25/13 [phone]     Chief Complaint  Patient presents with  . Hypertension   (Consider location/radiation/quality/duration/timing/severity/associated sxs/prior Treatment) HPI This patient is a very pleasant 38 year old homeless man with a history of hypertension who has been without his medications for the past 4 months and has been using cocaine regularly.  He presents to get his blood pressure checked. He said that he had a diffuse headache yesterday which lasted about 5 hours and was moderate in severity. Today, he thought about his headache from yesterday and decided that he should get his blood pressure checked in the emergency department.  He denies any headache at present. He denies any focal neurologic deficits. Visual changes. No chest pain or difficulty breathing./Cocaine use was 48 hours ago, per the patient.  Past Medical History  Diagnosis Date  . Hypertension    Past Surgical History  Procedure Laterality Date  . Appendectomy     No family history on file. History  Substance Use Topics  . Smoking status: Current Every Day Smoker  . Smokeless tobacco: Not on file  . Alcohol Use: Yes    Review of Systems Ten point review of symptoms performed and is negative with the exception of symptoms noted above.   Allergies  Review of patient's allergies indicates no known allergies.  Home Medications   Current Outpatient Rx  Name  Route  Sig  Dispense  Refill  . ciprofloxacin (CIPRO) 500 MG tablet   Oral   Take 1 tablet (500 mg total) by mouth 2 (two) times daily.   28 tablet   0   . hydrochlorothiazide (HYDRODIURIL) 25 MG tablet   Oral   Take 1 tablet (25 mg total) by mouth daily.   30 tablet   0    BP 204/114  Pulse 70  Temp(Src) 98 F (36.7 C) (Oral)  Resp 20  Ht 5\' 8"  (1.727 m)  Wt 170 lb (77.111 kg)  BMI 25.85 kg/m2  SpO2 100% Physical Exam Gen:  well developed and well nourished appearing Head: NCAT Eyes: PERL, EOMI Nose: no epistaixis or rhinorrhea Mouth/throat: mucosa is moist and pink Neck: supple, no stridor Lungs: CTA B, no wheezing, rhonchi or rales CV: RRR, no murmur, extremities appear well perfused.  Abd: soft, notender, nondistended Back: no ttp, no cva ttp Skin: warm and dry Ext: normal to inspection, no dependent edema Neuro: CN ii-xii grossly intact, no focal deficits Psyche; normal affect,  calm and cooperative.   ED Course  Procedures (including critical care time) Labs Review EKG: nsr, no acute ischemic changes, normal intervals, normal axis, normal qrs complex  MDM  Patient will be treated for hypertensive urgency and we will refill his prescription for HCTZ. [facility] the [facility] and suggested that he follow up there. He is counseled re: return precautions.   Brandt LoosenJulie Kerman Pfost, MD 04/25/13 [phone]  [id number]:  No improvement in BP with labetalol 20mg  IV.  We will tx with Clonidine 0.2mg  po and check BMP, CBC, U/A and UDS in anticipation of possible need for admission for hypertensive crisis. However, the patient is adamant about wanting to be discharged.   Brandt LoosenJulie Annetta Deiss, MD 04/25/13 [phone]

## 2013-08-28 ENCOUNTER — Encounter (HOSPITAL_COMMUNITY): Payer: Self-pay | Admitting: Emergency Medicine

## 2013-08-28 ENCOUNTER — Emergency Department (HOSPITAL_COMMUNITY): Payer: Self-pay

## 2013-08-28 ENCOUNTER — Emergency Department (HOSPITAL_COMMUNITY)
Admission: EM | Admit: 2013-08-28 | Discharge: 2013-08-28 | Disposition: A | Discharge status: Home / Self Care | Payer: Self-pay | Attending: Emergency Medicine | Admitting: Emergency Medicine

## 2013-08-28 DIAGNOSIS — F17200 Nicotine dependence, unspecified, uncomplicated: Secondary | ICD-10-CM | POA: Insufficient documentation

## 2013-08-28 DIAGNOSIS — I861 Scrotal varices: Secondary | ICD-10-CM | POA: Insufficient documentation

## 2013-08-28 DIAGNOSIS — F411 Generalized anxiety disorder: Secondary | ICD-10-CM | POA: Insufficient documentation

## 2013-08-28 DIAGNOSIS — R599 Enlarged lymph nodes, unspecified: Secondary | ICD-10-CM | POA: Insufficient documentation

## 2013-08-28 DIAGNOSIS — I10 Essential (primary) hypertension: Secondary | ICD-10-CM | POA: Insufficient documentation

## 2013-08-28 DIAGNOSIS — A5909 Other urogenital trichomoniasis: Secondary | ICD-10-CM | POA: Insufficient documentation

## 2013-08-28 DIAGNOSIS — A599 Trichomoniasis, unspecified: Secondary | ICD-10-CM

## 2013-08-28 LAB — URINE MICROSCOPIC-ADD ON

## 2013-08-28 LAB — URINALYSIS, ROUTINE W REFLEX MICROSCOPIC
Bilirubin Urine: NEGATIVE
GLUCOSE, UA: NEGATIVE mg/dL
Ketones, ur: NEGATIVE mg/dL
Leukocytes, UA: NEGATIVE
NITRITE: NEGATIVE
Protein, ur: >300 mg/dL — AB
Specific Gravity, Urine: 1.024 (ref 1.005–1.030)
Urobilinogen, UA: 0.2 mg/dL (ref 0.0–1.0)
pH: 6.5 (ref 5.0–8.0)

## 2013-08-28 LAB — COMPREHENSIVE METABOLIC PANEL
ALT: 14 U/L (ref 0–53)
AST: 21 U/L (ref 0–37)
Albumin: 2.4 g/dL — ABNORMAL LOW (ref 3.5–5.2)
Alkaline Phosphatase: 58 U/L (ref 39–117)
BILIRUBIN TOTAL: 0.3 mg/dL (ref 0.3–1.2)
BUN: 17 mg/dL (ref 6–23)
CALCIUM: 8.8 mg/dL (ref 8.4–10.5)
CHLORIDE: 105 meq/L (ref 96–112)
CO2: 27 meq/L (ref 19–32)
Creatinine, Ser: 0.99 mg/dL (ref 0.50–1.35)
GFR calc Af Amer: >90 mL/min (ref >90)
GFR calc non Af Amer: >90 mL/min (ref >90)
Glucose, Bld: 95 mg/dL (ref 70–99)
Potassium: 4.2 mEq/L (ref 3.7–5.3)
SODIUM: 141 meq/L (ref 137–147)
Total Protein: 5.7 g/dL — ABNORMAL LOW (ref 6.0–8.3)

## 2013-08-28 LAB — CBC WITH DIFFERENTIAL/PLATELET
BASOS ABS: 0 10*3/uL (ref 0.0–0.1)
Basophils Relative: 0 % (ref 0–1)
Eosinophils Absolute: 0.1 10*3/uL (ref 0.0–0.7)
Eosinophils Relative: 2 % (ref 0–5)
HCT: 38.6 % — ABNORMAL LOW (ref 39.0–52.0)
Hemoglobin: 13.8 g/dL (ref 13.0–17.0)
LYMPHS PCT: 20 % (ref 12–46)
Lymphs Abs: 1.7 10*3/uL (ref 0.7–4.0)
MCH: 29.9 pg (ref 26.0–34.0)
MCHC: 35.8 g/dL (ref 30.0–36.0)
MCV: 83.5 fL (ref 78.0–100.0)
Monocytes Absolute: 0.5 10*3/uL (ref 0.1–1.0)
Monocytes Relative: 7 % (ref 3–12)
Neutro Abs: 6 10*3/uL (ref 1.7–7.7)
Neutrophils Relative %: 71 % (ref 43–77)
PLATELETS: 170 10*3/uL (ref 150–400)
RBC: 4.62 MIL/uL (ref 4.22–5.81)
RDW: 12.6 % (ref 11.5–15.5)
WBC: 8.3 10*3/uL (ref 4.0–10.5)

## 2013-08-28 MED ORDER — OXYCODONE-ACETAMINOPHEN 5-325 MG PO TABS
2.0000 | ORAL_TABLET | Freq: Once | ORAL | Status: AC
  Administered 2013-08-28: 2 via ORAL
  Filled 2013-08-28: qty 2

## 2013-08-28 MED ORDER — AZITHROMYCIN 1 G PO PACK
1.0000 g | PACK | Freq: Once | ORAL | Status: AC
  Administered 2013-08-28: 1 g via ORAL
  Filled 2013-08-28: qty 1

## 2013-08-28 MED ORDER — CLONIDINE HCL 0.1 MG PO TABS
0.1000 mg | ORAL_TABLET | Freq: Once | ORAL | Status: AC
  Administered 2013-08-28: 0.1 mg via ORAL
  Filled 2013-08-28: qty 1

## 2013-08-28 MED ORDER — HYDROCHLOROTHIAZIDE 25 MG PO TABS: 25.0000 mg | ORAL_TABLET | Freq: Every day | ORAL | Status: DC

## 2013-08-28 MED ORDER — METRONIDAZOLE 500 MG PO TABS
500.0000 mg | ORAL_TABLET | Freq: Once | ORAL | Status: AC
  Administered 2013-08-28: 500 mg via ORAL
  Filled 2013-08-28: qty 1

## 2013-08-28 MED ORDER — METRONIDAZOLE 500 MG PO TABS: 500.0000 mg | ORAL_TABLET | Freq: Two times a day (BID) | ORAL | Status: DC

## 2013-08-28 MED ORDER — HYDROCHLOROTHIAZIDE 25 MG PO TABS
25.0000 mg | ORAL_TABLET | Freq: Once | ORAL | Status: AC
  Administered 2013-08-28: 25 mg via ORAL
  Filled 2013-08-28: qty 1

## 2013-08-28 MED ORDER — LIDOCAINE HCL (PF) 1 % IJ SOLN
INTRAMUSCULAR | Status: AC
  Administered 2013-08-28: 0.9 mL
  Filled 2013-08-28: qty 5

## 2013-08-28 MED ORDER — CEFTRIAXONE SODIUM 250 MG IJ SOLR
250.0000 mg | Freq: Once | INTRAMUSCULAR | Status: AC
  Administered 2013-08-28: 250 mg via INTRAMUSCULAR
  Filled 2013-08-28: qty 250

## 2013-08-28 NOTE — Progress Notes (Signed)
  CARE MANAGEMENT ED NOTE 08/29/2013  Patient:  Marc Mata,Marc Mata   Account Number:  [id number]  Date Initiated:  08/28/2013  Documentation initiated by:  Michel Bickers  Subjective/Objective Assessment:   Medication assistance and establsihing care with PCP     Subjective/Objective Assessment Detail:     Action/Plan:   Action/Plan Detail:   Anticipated DC Date:  08/28/2013     Status Recommendation to Physician:   Result of Recommendation:    Other ED Services  Appropriate Status Consult    DC Planning Services  Medication Assistance  PCP issues    Choice offered to / List presented to:  C-1 Patient          Status of service:  Completed, signed off  ED Comments:   ED Comments Detail:  08/29/13 16:16 Beecher Mcardle RN BSN [phone]  ED CM received incoming call from patient concerning a prescription for hydralazine he forgot in the ED. Pt presented to [facility] ED with  Abdominal and groin pain.  Pt has had 4 ED visits in the past 6 months. Confirmed information. Pt is unisured with a PCP.  Discussed the importance and benefits to a PCP. Pt verbalized understanding and agrees.  Offered assistance with establishing f/u care, patient is agreeable. Provided patient with information regarding [facility], address and phone number given. Instructed patient to walk in on Tuesday 5/26 at 900 to schedule appointment for f/u  Pt added that he cannot afford his medications. Pt instructed that after he schedules appointment he can take prescription to [facility] pharmacy to have filled. Patient verbalized understanding of instruction teach back done. Offered to fax Prescription for hydralazine to pharmacy of patient's choice. Pt requested to fax to [facility]. Made patient aware that Pharmacy is closed until Tuesday 5/26, still insist on due to lack funds per patient. Prescription was faxed to [facility] [phone]. Sent email to [facility] pharmacist regarding prescription. No further ED CM needs identified

## 2013-08-28 NOTE — ED Notes (Signed)
Patient presents with c.o lower abd pain and groin pain.  States he ate a lot of different chicken  And then had a large sub.  States he does not have trouble urinating.

## 2013-08-28 NOTE — ED Provider Notes (Signed)
CSN: [id number]     Arrival date & time 08/28/13  4098 History   First MD Initiated Contact with Patient 08/28/13 [phone]     Chief Complaint  Patient presents with  . Abdominal Pain  . Groin Pain     (Consider location/radiation/quality/duration/timing/severity/associated sxs/prior Treatment) The history is provided by the patient.  Marc Mata is a 38 y.o. male hx of HTN here with testicular pain, groin pain. He ate at dinner with some fried chicken last night. Afterwards he was hanging out with a girlfriend of his. He was being intimate with her but she didn't want to have sex so he was trying to "hump her" with his pants on. Denies actually having sex for the last 4 months. Had neg GC chlamydia in January. He came to the ED for lower groin pain. While urinating in the ED, he saw some discharge which he thought was "cum". Then had diffuse groin and testicular pain. Denies urinary symptoms. Also was noted to be hypertensive but has been uncompliant with his BP meds.    Past Medical History  Diagnosis Date  . Hypertension    Past Surgical History  Procedure Laterality Date  . Appendectomy     History reviewed. No pertinent family history. History  Substance Use Topics  . Smoking status: Current Every Day Smoker  . Smokeless tobacco: Never Used  . Alcohol Use: Yes     Comment: ocassionally    Review of Systems  Gastrointestinal: Positive for abdominal pain.  Genitourinary: Positive for testicular pain.  All other systems reviewed and are negative.     Allergies  Review of patient's allergies indicates no known allergies.  Home Medications   Prior to Admission medications   Not on File   BP 171/110  Pulse 63  Temp(Src) 98.3 F (36.8 C) (Oral)  Resp 16  Ht 5\' 9"  (1.753 m)  Wt 168 lb (76.204 kg)  BMI 24.80 kg/m2  SpO2 100% Physical Exam  Nursing note and vitals reviewed. Constitutional: He is oriented to person, place, and time. He appears well-nourished.   Anxious, uncomfortable   HENT:  Head: Normocephalic.  Mouth/Throat: Oropharynx is clear and moist.  Eyes: Conjunctivae and EOM are normal. Pupils are equal, round, and reactive to light.  Neck: Normal range of motion. Neck supple.  Cardiovascular: Normal rate, regular rhythm and normal heart sounds.   Pulmonary/Chest: Effort normal and breath sounds normal. No respiratory distress. He has no wheezes. He has no rales.  Abdominal: Soft. Bowel sounds are normal.  Some inguinal lymph nodes slightly tender, minimally swollen.   Genitourinary:  ? Variceal bilateral testicles. Mild diffuse tenderness. Good lie and good cremasteric reflex    Musculoskeletal: Normal range of motion.  Neurological: He is alert and oriented to person, place, and time. No cranial nerve deficit. Coordination normal.  Skin: Skin is warm and dry.  Psychiatric: He has a normal mood and affect. His behavior is normal. Judgment and thought content normal.    ED Course  Procedures (including critical care time) Labs Review Labs Reviewed  URINALYSIS, ROUTINE W REFLEX MICROSCOPIC - Abnormal; Notable for the following:    APPearance CLOUDY (*)    Hgb urine dipstick LARGE (*)    Protein, ur >300 (*)    All other components within normal limits  CBC WITH DIFFERENTIAL - Abnormal; Notable for the following:    HCT 38.6 (*)    All other components within normal limits  COMPREHENSIVE METABOLIC PANEL - Abnormal; Notable for the  following:    Total Protein 5.7 (*)    Albumin 2.4 (*)    All other components within normal limits  GC/CHLAMYDIA PROBE AMP  URINE MICROSCOPIC-ADD ON    Imaging Review US Scrotum  08/28/2013   CLINICAL DATA:  Groin pain  EXAM: SCROTAL ULTRASOUND  DOPPLER ULTRASOUND OF THE TESTICLES  TECHNIQUE: Complete ultrasound examination of the testicles, epididymis, and other scrotal structures was performed. Color and spectral Doppler ultrasound were also utilized to evaluate blood flow to the testicles.   COMPARISON:  None.  FINDINGS: Right testicle  Measurements: 4.8 x 2.5 x 3.3 cm. No mass or microlithiasis visualized.  Left testicle  Measurements: 4.7 x 2.4 x 3.5 cm. No mass or microlithiasis visualized.  Right epididymis: Normal in size and appearance. Tiny 5 x 4 x 4 mm epididymal cyst versus spermatocele.  Left epididymis: Normal in size and appearance. Tiny 5 x 5 x 4 mm epididymal cyst versus spermatocele.  Hydrocele: Trace fluid around the right testicle is likely within physiologic limits.  Varicocele: Engorged veins in the right inguinal region consistent with varicocele.  Pulsed Doppler interrogation of both testes demonstrates low resistance arterial and venous waveforms bilaterally.  Other: In the region palpable discomfort in the right inguinal canal there are several mildly prominent but not enlarged lymph nodes.  IMPRESSION: 1. Negative for testicular torsion. 2. In the area of palpable discomfort in the right inguinal region, there are several prominent but not enlarged lymph nodes which are likely reactive. 3. Right varicocele. An isolated right varicocele is an abnormal finding and suggests the possibility of right retroperitoneal lymphadenopathy or other process obstructing venous return. CT scan of the abdomen and pelvis with contrast is recommended for further evaluation. 4. Small incidental bilateral epididymal cysts versus spermatoceles.   Electronically Signed   By: Malachy Moan M.D.   On: 08/28/2013 09:06   [place] Art/ven Flow Abd Pelv Doppler  08/28/2013   CLINICAL DATA:  Groin pain  EXAM: SCROTAL ULTRASOUND  DOPPLER ULTRASOUND OF THE TESTICLES  TECHNIQUE: Complete ultrasound examination of the testicles, epididymis, and other scrotal structures was performed. Color and spectral Doppler ultrasound were also utilized to evaluate blood flow to the testicles.  COMPARISON:  None.  FINDINGS: Right testicle  Measurements: 4.8 x 2.5 x 3.3 cm. No mass or microlithiasis visualized.  Left testicle   Measurements: 4.7 x 2.4 x 3.5 cm. No mass or microlithiasis visualized.  Right epididymis: Normal in size and appearance. Tiny 5 x 4 x 4 mm epididymal cyst versus spermatocele.  Left epididymis: Normal in size and appearance. Tiny 5 x 5 x 4 mm epididymal cyst versus spermatocele.  Hydrocele: Trace fluid around the right testicle is likely within physiologic limits.  Varicocele: Engorged veins in the right inguinal region consistent with varicocele.  Pulsed Doppler interrogation of both testes demonstrates low resistance arterial and venous waveforms bilaterally.  Other: In the region palpable discomfort in the right inguinal canal there are several mildly prominent but not enlarged lymph nodes.  IMPRESSION: 1. Negative for testicular torsion. 2. In the area of palpable discomfort in the right inguinal region, there are several prominent but not enlarged lymph nodes which are likely reactive. 3. Right varicocele. An isolated right varicocele is an abnormal finding and suggests the possibility of right retroperitoneal lymphadenopathy or other process obstructing venous return. CT scan of the abdomen and pelvis with contrast is recommended for further evaluation. 4. Small incidental bilateral epididymal cysts versus spermatoceles.   Electronically Signed  By: Malachy MoanHeath  McCullough M.D.   On: 08/28/2013 09:06     EKG Interpretation None      MDM   Final diagnoses:  None    Leng Gwenevere AbbotMorton is a 38 y.o. male here with groin pain. No signs of SBO. I doubt intra abdominal pathology. Will get US scrotum to assess for possible torsion but I have low suspicion. Hypertensive, likely from uncompliance. I doubt hypertensive emergency.   11:31 AM BP improved with PO meds. Will d/c home on hydralazine. US showed no torsion, but R varicocele. Recommend outpatient CT and f/u for that. UA + sperm and trichomonas. He claims that he is not sexually active but I treated him for trich and STDs empirically and told him to notify  his partner. UA also has blood but he urinated after I swabbed him for GC/chlamydia and had some bleeding after swab. I doubt kidney stones.    Richardean Canalavid H Yao, MD 08/28/13 1134

## 2013-08-28 NOTE — Discharge Instructions (Signed)
Take flagyl for a week.   Take tylenol and motrin for pain.   Take HCTZ for high blood pressure.   Your partners may need to be treated as well.   You have varicocele of the right testicle. You need to see a urologist and might need a CT if you have persistent swelling.   Return to ER if you have fever, severe pain, vomiting, testicular pain.

## 2013-08-28 NOTE — ED Notes (Signed)
R 200/117  L 203/122  States he noticed he had a "different" kind of discharge.

## 2013-08-29 LAB — GC/CHLAMYDIA PROBE AMP
CT Probe RNA: NEGATIVE
GC Probe RNA: NEGATIVE

## 2013-09-15 ENCOUNTER — Ambulatory Visit: Payer: Self-pay | Attending: Internal Medicine

## 2014-02-12 ENCOUNTER — Observation Stay (HOSPITAL_COMMUNITY)
Admission: EM | Admit: 2014-02-12 | Discharge: 2014-02-15 | Disposition: A | Discharge status: Home / Self Care | Payer: Self-pay | Attending: Internal Medicine | Admitting: Internal Medicine

## 2014-02-12 ENCOUNTER — Encounter (HOSPITAL_COMMUNITY): Payer: Self-pay | Admitting: *Deleted

## 2014-02-12 ENCOUNTER — Emergency Department (HOSPITAL_COMMUNITY): Payer: Self-pay

## 2014-02-12 DIAGNOSIS — N179 Acute kidney failure, unspecified: Secondary | ICD-10-CM

## 2014-02-12 DIAGNOSIS — F1410 Cocaine abuse, uncomplicated: Secondary | ICD-10-CM | POA: Insufficient documentation

## 2014-02-12 DIAGNOSIS — I161 Hypertensive emergency: Secondary | ICD-10-CM | POA: Diagnosis present

## 2014-02-12 DIAGNOSIS — R0600 Dyspnea, unspecified: Secondary | ICD-10-CM

## 2014-02-12 DIAGNOSIS — M7989 Other specified soft tissue disorders: Secondary | ICD-10-CM | POA: Insufficient documentation

## 2014-02-12 DIAGNOSIS — I10 Essential (primary) hypertension: Principal | ICD-10-CM | POA: Diagnosis present

## 2014-02-12 DIAGNOSIS — Z9114 Patient's other noncompliance with medication regimen: Secondary | ICD-10-CM | POA: Insufficient documentation

## 2014-02-12 DIAGNOSIS — Z590 Homelessness: Secondary | ICD-10-CM | POA: Insufficient documentation

## 2014-02-12 DIAGNOSIS — F17210 Nicotine dependence, cigarettes, uncomplicated: Secondary | ICD-10-CM | POA: Insufficient documentation

## 2014-02-12 DIAGNOSIS — F1210 Cannabis abuse, uncomplicated: Secondary | ICD-10-CM | POA: Insufficient documentation

## 2014-02-12 DIAGNOSIS — R809 Proteinuria, unspecified: Secondary | ICD-10-CM | POA: Insufficient documentation

## 2014-02-12 LAB — I-STAT CHEM 8, ED
BUN: 16 mg/dL (ref 6–23)
CHLORIDE: 108 meq/L (ref 96–112)
CREATININE: 1.4 mg/dL — AB (ref 0.50–1.35)
Calcium, Ion: 0.96 mmol/L — ABNORMAL LOW (ref 1.12–1.23)
Glucose, Bld: 72 mg/dL (ref 70–99)
HCT: 38 % — ABNORMAL LOW (ref 39.0–52.0)
Hemoglobin: 12.9 g/dL — ABNORMAL LOW (ref 13.0–17.0)
Potassium: 4.5 mEq/L (ref 3.7–5.3)
Sodium: 138 mEq/L (ref 137–147)
TCO2: 24 mmol/L (ref 0–100)

## 2014-02-12 LAB — CBC
HCT: 39.4 % (ref 39.0–52.0)
HEMOGLOBIN: 13.7 g/dL (ref 13.0–17.0)
MCH: 29.3 pg (ref 26.0–34.0)
MCHC: 34.8 g/dL (ref 30.0–36.0)
MCV: 84.2 fL (ref 78.0–100.0)
Platelets: 172 10*3/uL (ref 150–400)
RBC: 4.68 MIL/uL (ref 4.22–5.81)
RDW: 13 % (ref 11.5–15.5)
WBC: 5.7 10*3/uL (ref 4.0–10.5)

## 2014-02-12 LAB — COMPREHENSIVE METABOLIC PANEL
ALT: 13 U/L (ref 0–53)
AST: 23 U/L (ref 0–37)
Albumin: 2.2 g/dL — ABNORMAL LOW (ref 3.5–5.2)
Alkaline Phosphatase: 52 U/L (ref 39–117)
Anion gap: 11 (ref 5–15)
BUN: 15 mg/dL (ref 6–23)
CO2: 27 meq/L (ref 19–32)
Calcium: 8.5 mg/dL (ref 8.4–10.5)
Chloride: 105 mEq/L (ref 96–112)
Creatinine, Ser: 1.26 mg/dL (ref 0.50–1.35)
GFR calc Af Amer: 83 mL/min — ABNORMAL LOW (ref >90)
GFR, EST NON AFRICAN AMERICAN: 72 mL/min — AB (ref >90)
Glucose, Bld: 78 mg/dL (ref 70–99)
Potassium: 4.6 mEq/L (ref 3.7–5.3)
Sodium: 143 mEq/L (ref 137–147)
Total Protein: 5.3 g/dL — ABNORMAL LOW (ref 6.0–8.3)

## 2014-02-12 LAB — CBC WITH DIFFERENTIAL/PLATELET
Basophils Absolute: 0 10*3/uL (ref 0.0–0.1)
Basophils Relative: 1 % (ref 0–1)
Eosinophils Absolute: 0.3 10*3/uL (ref 0.0–0.7)
Eosinophils Relative: 7 % — ABNORMAL HIGH (ref 0–5)
HEMATOCRIT: 39 % (ref 39.0–52.0)
Hemoglobin: 13.5 g/dL (ref 13.0–17.0)
LYMPHS PCT: 41 % (ref 12–46)
Lymphs Abs: 1.8 10*3/uL (ref 0.7–4.0)
MCH: 29.3 pg (ref 26.0–34.0)
MCHC: 34.6 g/dL (ref 30.0–36.0)
MCV: 84.8 fL (ref 78.0–100.0)
MONO ABS: 0.5 10*3/uL (ref 0.1–1.0)
Monocytes Relative: 12 % (ref 3–12)
NEUTROS ABS: 1.6 10*3/uL — AB (ref 1.7–7.7)
Neutrophils Relative %: 39 % — ABNORMAL LOW (ref 43–77)
Platelets: 163 10*3/uL (ref 150–400)
RBC: 4.6 MIL/uL (ref 4.22–5.81)
RDW: 13 % (ref 11.5–15.5)
WBC: 4.2 10*3/uL (ref 4.0–10.5)

## 2014-02-12 LAB — CREATININE, SERUM
Creatinine, Ser: 1.24 mg/dL (ref 0.50–1.35)
GFR calc non Af Amer: 73 mL/min — ABNORMAL LOW (ref >90)
GFR, EST AFRICAN AMERICAN: 84 mL/min — AB (ref >90)

## 2014-02-12 LAB — TROPONIN I: Troponin I: <0.3 ng/mL (ref <0.30)

## 2014-02-12 LAB — PRO B NATRIURETIC PEPTIDE: Pro B Natriuretic peptide (BNP): 47.9 pg/mL (ref 0–125)

## 2014-02-12 MED ORDER — HYDRALAZINE HCL 20 MG/ML IJ SOLN
10.0000 mg | Freq: Once | INTRAMUSCULAR | Status: AC
  Administered 2014-02-12: 10 mg via INTRAVENOUS
  Filled 2014-02-12: qty 0.5

## 2014-02-12 MED ORDER — ONDANSETRON HCL 4 MG/2ML IJ SOLN
4.0000 mg | Freq: Four times a day (QID) | INTRAMUSCULAR | Status: DC | PRN
  Filled 2014-02-12: qty 2

## 2014-02-12 MED ORDER — DICLOFENAC SODIUM 1 % TD GEL
2.0000 g | Freq: Four times a day (QID) | TRANSDERMAL | Status: DC
  Administered 2014-02-12 – 2014-02-14 (×7): 2 g via TOPICAL
  Filled 2014-02-12 (×2): qty 100

## 2014-02-12 MED ORDER — ONDANSETRON HCL 4 MG PO TABS: 4.0000 mg | ORAL_TABLET | Freq: Four times a day (QID) | ORAL | Status: DC | PRN

## 2014-02-12 MED ORDER — HYDROCHLOROTHIAZIDE 25 MG PO TABS
12.5000 mg | ORAL_TABLET | Freq: Once | ORAL | Status: AC
  Administered 2014-02-12: 12.5 mg via ORAL
  Filled 2014-02-12: qty 1

## 2014-02-12 MED ORDER — SODIUM CHLORIDE 0.9 % IV SOLN: 250.0000 mL | INTRAVENOUS | Status: DC | PRN

## 2014-02-12 MED ORDER — SODIUM CHLORIDE 0.9 % IJ SOLN
3.0000 mL | Freq: Two times a day (BID) | INTRAMUSCULAR | Status: DC
  Administered 2014-02-12 – 2014-02-14 (×6): 3 mL via INTRAVENOUS

## 2014-02-12 MED ORDER — SODIUM CHLORIDE 0.9 % IJ SOLN: 3.0000 mL | INTRAMUSCULAR | Status: DC | PRN

## 2014-02-12 MED ORDER — ACETAMINOPHEN 325 MG PO TABS
650.0000 mg | ORAL_TABLET | Freq: Four times a day (QID) | ORAL | Status: DC | PRN
  Administered 2014-02-13: 650 mg via ORAL
  Filled 2014-02-12: qty 2

## 2014-02-12 MED ORDER — ENOXAPARIN SODIUM 40 MG/0.4ML ~~LOC~~ SOLN
40.0000 mg | SUBCUTANEOUS | Status: DC
  Administered 2014-02-12 – 2014-02-14 (×3): 40 mg via SUBCUTANEOUS
  Filled 2014-02-12 (×4): qty 0.4

## 2014-02-12 MED ORDER — FUROSEMIDE 20 MG PO TABS
20.0000 mg | ORAL_TABLET | Freq: Once | ORAL | Status: AC
  Administered 2014-02-12: 20 mg via ORAL
  Filled 2014-02-12 (×2): qty 1

## 2014-02-12 MED ORDER — HYDRALAZINE HCL 20 MG/ML IJ SOLN
10.0000 mg | Freq: Once | INTRAMUSCULAR | Status: AC
  Administered 2014-02-12: 10 mg via INTRAVENOUS

## 2014-02-12 MED ORDER — HYDRALAZINE HCL 20 MG/ML IJ SOLN
10.0000 mg | INTRAMUSCULAR | Status: DC | PRN
  Administered 2014-02-12 – 2014-02-13 (×2): 10 mg via INTRAVENOUS
  Filled 2014-02-12 (×3): qty 1

## 2014-02-12 MED ORDER — HYDROCHLOROTHIAZIDE 25 MG PO TABS
25.0000 mg | ORAL_TABLET | Freq: Every day | ORAL | Status: DC
  Administered 2014-02-12 – 2014-02-15 (×4): 25 mg via ORAL
  Filled 2014-02-12 (×5): qty 1

## 2014-02-12 MED ORDER — HYDRALAZINE HCL 20 MG/ML IJ SOLN: 20.0000 mg | Freq: Once | INTRAMUSCULAR | Status: DC

## 2014-02-12 MED ORDER — ACETAMINOPHEN 650 MG RE SUPP: 650.0000 mg | Freq: Four times a day (QID) | RECTAL | Status: DC | PRN

## 2014-02-12 NOTE — H&P (Addendum)
[facility] History and Physical  Marc Fairlycey Macpherson AVW:[id number] DOB: 12/27/1975 DOA: 02/12/2014   PCP: No PCP Per Patient    Chief Complaint: pedal edema  HPI: Marc Mata is a 38 y.o. male other past medical history of hypertension who was previously on HCTZ but has been out of this medication for the past 8 months. He's noticed about a week of pedal edema and therefore decided to come to the ER to have his blood pressure medications filled. He had some very mild chest pain on the left side of his chest today while standing in the ER. It went away quickly on its own. Was not associated with any shortness of breath. He does not have any dyspnea on exertion unless he is severely exerting himself. No complaints of headaches or change in vision. No focal numbness weakness or tingling.  General: The patient denies anorexia, fever, weight loss Cardiac: Denies chest pain, syncope, palpitations, + pedal edema  Respiratory: Denies cough, shortness of breath, wheezing GI: Denies severe indigestion/heartburn, abdominal pain, nausea, vomiting, diarrhea and constipation GU: Denies hematuria, incontinence, dysuria  Musculoskeletal: Denies arthritis - had had some mild right arm pain for the past month or 2. No swelling pain-  is exacerbated on flexion at the elbow. Skin: Denies suspicious skin lesions Neurologic: Denies focal weakness or numbness, change in vision Psych: no depression or anxiety  Past Medical History  Diagnosis Date  . Hypertension     Past Surgical History  Procedure Laterality Date  . Appendectomy      Social History: smokes about 3 cigarettes, day, no alcohol use, uses [place]  Lives at home with friends, is not currently working   No Known Allergies  Family history: HTN in mother Wonda OldsCousin was on dialysis before dying    Prior to Admission medications   Medication Sig Start Date End Date Taking? Authorizing Provider  hydrochlorothiazide (HYDRODIURIL) 25 MG  tablet Take 1 tablet (25 mg total) by mouth daily. Patient not taking: Reported on 02/12/2014 08/28/13   Richardean Canalavid H Yao, MD     Physical Exam: Filed Vitals:   02/12/14 1300 02/12/14 1345 02/12/14 1400 02/12/14 1415  BP: 191/110 197/123 208/118 183/117  Pulse: 61 26 69 59  Temp:      TempSrc:      Resp: 21 23 24 22   SpO2: 99% 100% 100% 100%     General: wake alert oriented 3, no acute distress HEENT: Normocephalic and Atraumatic, Mucous membranes pink                PERRLA; EOM intact; No scleral icterus,                 Nares: Patent, Oropharynx: Clear, Fair Dentition                 Neck: FROM, no cervical lymphadenopathy, thyromegaly, carotid bruit or JVD;  Breasts: deferred CHEST WALL: No tenderness  CHEST: Normal respiration, clear to auscultation bilaterally  HEART: Regular rate and rhythm; no murmurs rubs or gallops  BACK: No kyphosis or scoliosis; no CVA tenderness  ABDOMEN: Positive Bowel Sounds, soft, non-tender; no masses, no organomegaly Rectal Exam: deferred EXTREMITIES: No cyanosis, clubbing- 2+ pitting edema Genitalia: not examined  SKIN:  no rash or ulceration  CNS: Alert and Oriented x 4, Nonfocal exam, CN 2-12 intact  Labs on Admission:  Basic Metabolic Panel:  Recent Labs Lab 02/12/14 1221 02/12/14 1223  NA 138 143  K 4.5 4.6  CL 108 105  CO2  --  27  GLUCOSE 72 78  BUN 16 15  CREATININE 1.40* 1.26  CALCIUM  --  8.5   Liver Function Tests:  Recent Labs Lab 02/12/14 1223  AST 23  ALT 13  ALKPHOS 52  BILITOT <0.2*  PROT 5.3*  ALBUMIN 2.2*   No results for input(s): LIPASE, AMYLASE in the last 168 hours. No results for input(s): AMMONIA in the last 168 hours. CBC:  Recent Labs Lab 02/12/14 1221 02/12/14 1223  WBC  --  4.2  NEUTROABS  --  1.6*  HGB 12.9* 13.5  HCT 38.0* 39.0  MCV  --  84.8  PLT  --  163   Cardiac Enzymes:  Recent Labs Lab 02/12/14 1223  TROPONINI <0.30    BNP (last 3 results)  Recent Labs   03/05/13 0946 02/12/14 1223  PROBNP 77.9 47.9   CBG: No results for input(s): GLUCAP in the last 168 hours.  Radiological Exams on Admission: Dg Chest 2 View  02/12/2014   CLINICAL DATA:  38 year old male with left-sided chest pain  EXAM: CHEST  2 VIEW  COMPARISON:  Prior chest x-ray 03/05/2013  FINDINGS: The lungs are clear and negative for focal airspace consolidation, pulmonary edema or suspicious pulmonary nodule. No pleural effusion or pneumothorax. Cardiac and mediastinal contours are within normal limits. No acute fracture or lytic or blastic osseous lesions. The visualized upper abdominal bowel gas pattern is unremarkable. Bolus shaped metallic radiopacity noted in the soft tissues posterior to T12.  IMPRESSION: No active cardiopulmonary disease.   Electronically Signed   By: Malachy MoanHeath  McCullough M.D.   On: 02/12/2014 13:21    EKG: Independently reviewed. Sinus rhythm at 62 bpm with inverted T waves in lead 3  Assessment/Plan Active Problems:   Hypertensive emergency- acute renal failure -when patient arrived systolic blood pressure was in the 170s but has climbed up to the 200s-initial creatinine was noted to be elevated but repeat is normal-difficult to tell which one of these is accurate- Will repeat tomorrow morning  - we'll resume HCTZ -We'll place on IV hydralazine PRN and oral hydralazine routine as heart rate is too low for beta blockers and calcium channel blockers -we'll give a low dose of Lasix  20 mg1 time- ECHO to ensure there is no right heart failure.   Right arm pain -likely musculoskeletal-will start Voltaren gel  Consulted: none  Code Status: full code Family Communication: none DVT Prophylaxis:Lovenox  Time spent: 45 minutes  Nashonda Limberg, MD [facility]  If 7PM-7AM, please contact night-coverage [email or web address] 02/12/2014, 3:11 PM

## 2014-02-12 NOTE — ED Notes (Signed)
Pt reports hx of htn but hasnt taken meds in 8 months and now having swelling to bilateral ankles and feet x 4 days. No distress noted at triage.

## 2014-02-12 NOTE — ED Notes (Signed)
Pt returned from xray

## 2014-02-12 NOTE — ED Provider Notes (Signed)
CSN: [id number]     Arrival date & time 02/12/14  1051 History   First MD Initiated Contact with Patient 02/12/14 1105     Chief Complaint  Patient presents with  . Leg Swelling     (Consider location/radiation/quality/duration/timing/severity/associated sxs/prior Treatment) HPI   38 year old male with history of hypertension currently on hydrochlorothiazide who presents with complaints of bilateral leg swelling.  Pt has trouble with leg swelling last year when he was diagnosed with HTN.  Pt was initially placed on hydrochlorothiazide but  Pt has been without his meds for 8 month, not follow up with anyone.  For the past 4 days he noticed gradual onset of bilateral leg swelling similar to prior.  He denies fever, chils, headache, cp, sob, productive cough, hemoptysis, abd pain, dysuria, leg pain, calf pain or rash.  Does admits to eating more fastfood this past week.  Pt is here requesting for medication refill and outpt resources.  Denies any prior hx of PE/DVT, no recent surgery, prolonged bed rest, or active cancer.      Past Medical History  Diagnosis Date  . Hypertension    Past Surgical History  Procedure Laterality Date  . Appendectomy     History reviewed. No pertinent family history. History  Substance Use Topics  . Smoking status: Current Every Day Smoker  . Smokeless tobacco: Never Used  . Alcohol Use: Yes     Comment: ocassionally    Review of Systems  Constitutional: Negative for fever.  Eyes: Negative for visual disturbance.  Respiratory: Negative for cough and shortness of breath.   Cardiovascular: Positive for chest pain and leg swelling.  Gastrointestinal: Negative for nausea and vomiting.  Neurological: Negative for syncope and weakness.      Allergies  Review of patient's allergies indicates no known allergies.  Home Medications   Prior to Admission medications   Medication Sig Start Date End Date Taking? Authorizing Provider  hydrochlorothiazide  (HYDRODIURIL) 25 MG tablet Take 1 tablet (25 mg total) by mouth daily. 08/28/13   Richardean Canalavid H Yao, MD  metroNIDAZOLE (FLAGYL) 500 MG tablet Take 1 tablet (500 mg total) by mouth 2 (two) times daily. One po bid x 7 days 08/28/13   Richardean Canalavid H Yao, MD   BP 170/99 mmHg  Pulse 93  Temp(Src) 98.4 F (36.9 C) (Oral)  Resp 18  SpO2 100% Physical Exam  Constitutional: He is oriented to person, place, and time. He appears well-developed and well-nourished. No distress.  HENT:  Head: Atraumatic.  Eyes: Conjunctivae are normal.  Neck: Normal range of motion. Neck supple. No JVD present.  Cardiovascular: Normal rate and regular rhythm.   No murmur heard. Pulmonary/Chest: He has no wheezes. He has no rales.  Abdominal: Soft. There is no tenderness.  Musculoskeletal: He exhibits edema (BLE: pitting edema bilaterally, without palpable cords, or erythema.  ).  Neurological: He is alert and oriented to person, place, and time. GCS eye subscore is 4. GCS verbal subscore is 5. GCS motor subscore is 6.  Skin: Rash (flaky skin noted to sole of feet bilaterally consistent with tinea pedis) noted.  Psychiatric: He has a normal mood and affect.    ED Course  Procedures (including critical care time)  12:13 PM Pt with known hx of HTN and peripheral edema, non compliance with his medication.  Pt here with 1+ pitting edema bilaterally.  Plan to prescribe hydrochlorothiazide 25mg  PO daily.  I will also check renal function, and ECG.  Care discussed with Dr. Littie DeedsGentry.  2:10 PM Patient's blood pressure continues to climb, last blood pressure check was 208/118. Lab is significant for a creatinine of 1.4, much elevated from prior baseline. Normal proBNP. Chest x-ray without active cardiopulmonary disease, EKG without acute ischemic changes and troponin is negative. Given that patient does not have a primary care doctor, has evidence of hypertensive emergency, and to have patient admitted for further management.  3:05 PM I  have consulted with [facility] Dr. Butler Denmark who agrees to admit pt to obs, tele, and recommend hydralazine 20mg  IV once    CRITICAL CARE Performed by: Sanjit Mcmichael Total critical care time: 30 min Critical care time was exclusive of separately billable procedures and treating other patients. Critical care was necessary to treat or prevent imminent or life-threatening deterioration. Critical care was time spent personally by me on the following activities: development of treatment plan with patient and/or surrogate as well as nursing, discussions with consultants, evaluation of patient's response to treatment, examination of patient, obtaining history from patient or surrogate, ordering and performing treatments and interventions, ordering and review of laboratory studies, ordering and review of radiographic studies, pulse oximetry and re-evaluation of patient's condition.   Labs Review Labs Reviewed  CBC WITH DIFFERENTIAL - Abnormal; Notable for the following:    Neutrophils Relative % 39 (*)    Neutro Abs 1.6 (*)    Eosinophils Relative 7 (*)    All other components within normal limits  COMPREHENSIVE METABOLIC PANEL - Abnormal; Notable for the following:    Total Protein 5.3 (*)    Albumin 2.2 (*)    Total Bilirubin <0.2 (*)    GFR calc non Af Amer 72 (*)    GFR calc Af Amer 83 (*)    All other components within normal limits  I-STAT CHEM 8, ED - Abnormal; Notable for the following:    Creatinine, Ser 1.40 (*)    Calcium, Ion 0.96 (*)    Hemoglobin 12.9 (*)    HCT 38.0 (*)    All other components within normal limits  TROPONIN I  PRO B NATRIURETIC PEPTIDE    Imaging Review Dg Chest 2 View  02/12/2014   CLINICAL DATA:  38 year old male with left-sided chest pain  EXAM: CHEST  2 VIEW  COMPARISON:  Prior chest x-ray 03/05/2013  FINDINGS: The lungs are clear and negative for focal airspace consolidation, pulmonary edema or suspicious pulmonary nodule. No pleural effusion or  pneumothorax. Cardiac and mediastinal contours are within normal limits. No acute fracture or lytic or blastic osseous lesions. The visualized upper abdominal bowel gas pattern is unremarkable. Bolus shaped metallic radiopacity noted in the soft tissues posterior to T12.  IMPRESSION: No active cardiopulmonary disease.   Electronically Signed   By: Malachy Moan M.D.   On: 02/12/2014 13:21     EKG Interpretation   Date/Time:  Sunday February 12 2014 12:14:38 EST Ventricular Rate:  62 PR Interval:  171 QRS Duration: 80 QT Interval:  387 QTC Calculation: 393 R Axis:   103 Text Interpretation:  Sinus rhythm Anterolateral infarct, age  indeterminate Confirmed by Mirian Mo [phone]) on 02/12/2014 12:22:12  PM      MDM   Final diagnoses:  Dyspnea  Hypertensive emergency without congestive heart failure    BP 183/117 mmHg  Pulse 59  Temp(Src) 98.4 F (36.9 C) (Oral)  Resp 22  SpO2 100%  I have reviewed nursing notes and vital signs. I personally reviewed the imaging tests through PACS system  I reviewed available ER/hospitalization  records thought the EMR     Fayrene HelperBowie Herbert Aguinaldo, PA-C 02/15/14 [id number]  Mirian MoMatthew Gentry, MD 02/16/14 2106

## 2014-02-12 NOTE — Plan of Care (Signed)
Problem: Phase I Progression Outcomes Goal: Pain controlled with appropriate interventions Outcome: Completed/Met Date Met:  02/12/14 Goal: Voiding-avoid urinary catheter unless indicated Outcome: Completed/Met Date Met:  02/12/14     

## 2014-02-13 DIAGNOSIS — R809 Proteinuria, unspecified: Secondary | ICD-10-CM

## 2014-02-13 DIAGNOSIS — I517 Cardiomegaly: Secondary | ICD-10-CM

## 2014-02-13 DIAGNOSIS — R600 Localized edema: Secondary | ICD-10-CM

## 2014-02-13 DIAGNOSIS — F1910 Other psychoactive substance abuse, uncomplicated: Secondary | ICD-10-CM

## 2014-02-13 LAB — URINALYSIS, ROUTINE W REFLEX MICROSCOPIC
BILIRUBIN URINE: NEGATIVE
Glucose, UA: NEGATIVE mg/dL
Ketones, ur: NEGATIVE mg/dL
LEUKOCYTES UA: NEGATIVE
NITRITE: NEGATIVE
PH: 7.5 (ref 5.0–8.0)
Protein, ur: >300 mg/dL — AB
Specific Gravity, Urine: 1.015 (ref 1.005–1.030)
UROBILINOGEN UA: 0.2 mg/dL (ref 0.0–1.0)

## 2014-02-13 LAB — URINE MICROSCOPIC-ADD ON

## 2014-02-13 LAB — BASIC METABOLIC PANEL
Anion gap: 10 (ref 5–15)
BUN: 12 mg/dL (ref 6–23)
CHLORIDE: 101 meq/L (ref 96–112)
CO2: 29 meq/L (ref 19–32)
CREATININE: 1.18 mg/dL (ref 0.50–1.35)
Calcium: 9.2 mg/dL (ref 8.4–10.5)
GFR calc Af Amer: 90 mL/min — ABNORMAL LOW (ref >90)
GFR calc non Af Amer: 77 mL/min — ABNORMAL LOW (ref >90)
Glucose, Bld: 80 mg/dL (ref 70–99)
Potassium: 4.3 mEq/L (ref 3.7–5.3)
Sodium: 140 mEq/L (ref 137–147)

## 2014-02-13 LAB — RAPID URINE DRUG SCREEN, HOSP PERFORMED
AMPHETAMINES: NOT DETECTED
BARBITURATES: NOT DETECTED
BENZODIAZEPINES: NOT DETECTED
Cocaine: POSITIVE — AB
OPIATES: NOT DETECTED
TETRAHYDROCANNABINOL: POSITIVE — AB

## 2014-02-13 LAB — CBC
HEMATOCRIT: 43.6 % (ref 39.0–52.0)
HEMOGLOBIN: 15.4 g/dL (ref 13.0–17.0)
MCH: 29.6 pg (ref 26.0–34.0)
MCHC: 35.3 g/dL (ref 30.0–36.0)
MCV: 83.7 fL (ref 78.0–100.0)
Platelets: 167 10*3/uL (ref 150–400)
RBC: 5.21 MIL/uL (ref 4.22–5.81)
RDW: 13 % (ref 11.5–15.5)
WBC: 5.4 10*3/uL (ref 4.0–10.5)

## 2014-02-13 LAB — LIPID PANEL
Cholesterol: 262 mg/dL — ABNORMAL HIGH (ref 0–200)
HDL: 73 mg/dL (ref >39)
LDL Cholesterol: 172 mg/dL — ABNORMAL HIGH (ref 0–99)
Total CHOL/HDL Ratio: 3.6 RATIO
Triglycerides: 87 mg/dL (ref <150)
VLDL: 17 mg/dL (ref 0–40)

## 2014-02-13 LAB — PROTEIN, URINE, RANDOM: Total Protein, Urine: 718.8 mg/dL

## 2014-02-13 MED ORDER — CLONIDINE HCL 0.1 MG PO TABS
0.1000 mg | ORAL_TABLET | Freq: Two times a day (BID) | ORAL | Status: DC
  Administered 2014-02-13 – 2014-02-15 (×5): 0.1 mg via ORAL
  Filled 2014-02-13 (×6): qty 1

## 2014-02-13 MED ORDER — CLONIDINE HCL 0.1 MG PO TABS
0.1000 mg | ORAL_TABLET | Freq: Once | ORAL | Status: AC
  Administered 2014-02-13: 0.1 mg via ORAL
  Filled 2014-02-13: qty 1

## 2014-02-13 MED ORDER — LORAZEPAM 2 MG/ML IJ SOLN
1.0000 mg | Freq: Once | INTRAMUSCULAR | Status: AC
  Administered 2014-02-13: 1 mg via INTRAVENOUS
  Filled 2014-02-13: qty 1

## 2014-02-13 MED ORDER — HYDRALAZINE HCL 20 MG/ML IJ SOLN
20.0000 mg | INTRAMUSCULAR | Status: DC | PRN
Start: 2014-02-13 — End: 2014-02-15

## 2014-02-13 MED ORDER — HYDRALAZINE HCL 20 MG/ML IJ SOLN
10.0000 mg | Freq: Once | INTRAMUSCULAR | Status: AC
  Administered 2014-02-13: 10 mg via INTRAVENOUS
  Filled 2014-02-13: qty 1

## 2014-02-13 NOTE — Progress Notes (Signed)
Nutrition Consult/Brief Note  RD consulted for low sodium diet education.  Pt yelling on the phone upon RD visit.  Says he is leaving AMA.  RD provided "Low Sodium Nutrition Therapy" handout from the [employer].  Current diet order is 2 gm Sodium, patient is consuming approximately 75-100% of meals at this time. Labs and medications reviewed. No further nutrition interventions warranted at this time. If additional nutrition issues arise, please re-consult RD.   Maureen ChattersKatie Hansini Clodfelter, RD, LDN Pager #: [phone] After-Hours Pager #: [phone]

## 2014-02-13 NOTE — Progress Notes (Signed)
Talked with T. Claiborne Billingsallahan and made aware of past two anxiety and vomiting spells. Benedetto Coons. Callahan in to examine pt.  Ativan IV ordered.

## 2014-02-13 NOTE — Progress Notes (Signed)
Talked with T.Callahan from [facility] and made aware of BP still elevated after giving 10mg  hydralazine IV as ordered. Benedetto Coons. Callahan gave order for an additional one time dose of 10mg  IV Hydralazine. Will continue to reassess.

## 2014-02-13 NOTE — Progress Notes (Signed)
Paged T. Claiborne BillingsCallahan with [facility] to make aware of pt reported seeing spots when standing up to use bathroom and having a headache. Tylenol given as ordered, and 10mg  IV hydralazine given per order. Bp 201/117. Will continue to assess.

## 2014-02-13 NOTE — Progress Notes (Signed)
Pt with another anxiety type attack. Got up nauseous , vomited. Sweaty all over feeling anxious. In to room and calmed pt down. Encouraged to calm down and take deep breaths. Pt eventually calmed down. Bp down from earlier 179/103. Catapres given as ordered. Will continue to assess.

## 2014-02-13 NOTE — Progress Notes (Signed)
Order for 2nd 10mg  IV Hydralazine to be given. Prior to giving 2nd dose after speaking with T. Claiborne Billingsallahan from [facility] pt called RN into room and had vomited liquid and Tylenol just taken. He states his head still hurts. I encouraged pt to use urinal in bed and not get up until his Bp was under control. Pt stated he could get up.  Gave 2nd IV hydralazine slowly per order. Pt stated as giving it that his headache got worse all of a sudden. Called and talked with T. Claiborne BillingsCallahan again to make aware of vomiting and pt stating his headache got worse.  Will continue to assess.

## 2014-02-13 NOTE — Progress Notes (Signed)
PROGRESS NOTE    Marc Mata AVW:[id number] DOB: [birth year] DOA: 02/12/2014 PCP: No PCP Per Patient  HPI/Brief narrative 38 year old male patient with history of HTN, noncompliant with medications or M.D. follow-ups, polysubstance abuse - tobacco, THC and cocaine presented to ED with complaints of bilateral leg edema, transient chest discomfort in ED which resolved spontaneously and was not associated with dyspnea and was found to have markedly elevated blood pressures.   Assessment/Plan:  1. Hypertensive urgency/accelerated hypertension: Secondary to medication noncompliance and ongoing cocaine abuse. Patient was counseled extensively in the presence of his nurse regarding abstinence from substance abuse. He verbalized understanding. Continue HCTZ. If blood pressures are not adequately controlled, may need to add second agent at least temporarily i.e. Clonidine or labetalol. We'll monitor closely. 2. Bilateral leg edema: Unclear etiology. R/o Nephrotic range proteinuria. Hypoalbuminemia contributing. Patient does complain of some DOE but no other features of CHF at this time. Continue HCTZ. proBNP normal. 2-D echo: Mild LVH, LVEF 65-70 percent and grade 1 diastolic dysfunction. 3. 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 Recommend adequate blood pressure control and monitor renal functions closely as outpatient. Will check 24-hour urine protein->300 mg per DL of protein on urine microscopy in May 2015. Rule out nephrotic range proteinuria. Check HIV. 4. Polysubstance abuse-tobacco, THC and cocaine: Cessation counseled. 5. Medication noncompliance: Counseled extensively. 6. Proteinuria: Management as above   Code Status: full Family Communication: none at bedside. Disposition Plan: home when medically stable.   Consultants:  none  Procedures:  none  Antibiotics:  2-D echo 02/13/14: Study Conclusions  - Left ventricle: The  cavity size was normal. Wall thickness was increased in a pattern of mild LVH. Systolic function was vigorous. The estimated ejection fraction was in the range of 65% to 70%. Wall motion was normal; there were no regional wall motion abnormalities. Doppler parameters are consistent with abnormal left ventricular relaxation (grade 1 diastolic dysfunction).  Subjective: Mild intermittent headaches. No visual symptoms. Denies chest pain or dyspnea.  Objective: Filed Vitals:   02/12/14 2215 02/12/14 2332 02/13/14 0349 02/13/14 0533  BP: 195/109 178/94 201/117 179/103  Pulse: 77 93 82   Temp:   98.4 F (36.9 C)   TempSrc:   Oral   Resp:      SpO2:   100%     Intake/Output Summary (Last 24 hours) at 02/13/14 1301 Last data filed at 02/13/14 0830  Gross per 24 hour  Intake    360 ml  Output      0 ml  Net    360 ml   There were no vitals filed for this visit.   Exam:  General exam: pleasant young male lying comfortably supine in bed. Multiple tattoos. Respiratory system: Clear. No increased work of breathing. Cardiovascular system: S1 & S2 heard, RRR. No JVD, murmurs, gallops, clicks. 1+ pitting bilateral leg edema. Telemetry: Sinus rhythm. Gastrointestinal system: Abdomen is nondistended, soft and nontender. Normal bowel sounds heard. Central nervous system: Alert and oriented. No focal neurological deficits. Extremities: Symmetric 5 x 5 power.   Data Reviewed: Basic Metabolic Panel:  Recent Labs Lab 02/12/14 1221 02/12/14 1223 02/12/14 1715 02/13/14 0345  NA 138 143  --  140  K 4.5 4.6  --  4.3  CL 108 105  --  101  CO2  --  27  --  29  GLUCOSE 72 78  --  80  BUN 16 15  --  12  CREATININE 1.40* 1.26 1.24 1.18  CALCIUM  --  8.5  --  9.2   Liver Function Tests:  Recent Labs Lab 02/12/14 1223  AST 23  ALT 13  ALKPHOS 52  BILITOT <0.2*  PROT 5.3*  ALBUMIN 2.2*   No results for input(s): LIPASE, AMYLASE in the last 168 hours. No results for  input(s): AMMONIA in the last 168 hours. CBC:  Recent Labs Lab 02/12/14 1221 02/12/14 1223 02/12/14 1715 02/13/14 0345  WBC  --  4.2 5.7 5.4  NEUTROABS  --  1.6*  --   --   HGB 12.9* 13.5 13.7 15.4  HCT 38.0* 39.0 39.4 43.6  MCV  --  84.8 84.2 83.7  PLT  --  163 172 167   Cardiac Enzymes:  Recent Labs Lab 02/12/14 1223  TROPONINI <0.30   BNP (last 3 results)  Recent Labs  03/05/13 0946 02/12/14 1223  PROBNP 77.9 47.9   CBG: No results for input(s): GLUCAP in the last 168 hours.  No results found for this or any previous visit (from the past 240 hour(s)).    Additional labs: 1. UDS positive for cocaine and THC.     Studies: Dg Chest 2 View  02/12/2014   CLINICAL DATA:  38 year old male with left-sided chest pain  EXAM: CHEST  2 VIEW  COMPARISON:  Prior chest x-ray 03/05/2013  FINDINGS: The lungs are clear and negative for focal airspace consolidation, pulmonary edema or suspicious pulmonary nodule. No pleural effusion or pneumothorax. Cardiac and mediastinal contours are within normal limits. No acute fracture or lytic or blastic osseous lesions. The visualized upper abdominal bowel gas pattern is unremarkable. Bolus shaped metallic radiopacity noted in the soft tissues posterior to T12.  IMPRESSION: No active cardiopulmonary disease.   Electronically Signed   By: Malachy MoanHeath  McCullough M.D.   On: 02/12/2014 13:21        Scheduled Meds: . diclofenac sodium  2 g Topical QID  . enoxaparin (LOVENOX) injection  40 mg Subcutaneous Q24H  . hydrochlorothiazide  25 mg Oral Daily  . sodium chloride  3 mL Intravenous Q12H   Continuous Infusions:   Active Problems:   Hypertensive emergency   Acute renal failure    Time spent: 45 minutes.    Marcellus ScottHONGALGI,ANAND, MD, FACP, FHM. [facility] Pager [phone]  If 7PM-7AM, please contact night-coverage [email or web address] Password [email or web address] 02/13/2014, 1:01 PM    LOS: 1 day

## 2014-02-13 NOTE — Progress Notes (Signed)
[facility] hospitalist progress note. Chief complaint. Hypertension, nausea, anxiety. This 38 year old male admitted recently with hypertensive urgency. His blood pressures have been elevated this morning in the 190-200 range.I have given him extra IV hydralazine and increased his routine when necessary hydralazine ordered. Nursing reported to me that the patient had some incidence of vomiting and intermittent periods of anxiety where he becomes diaphoreticandand also has a headache.. I came up to see the patient at bedside. He indicates to me that he has been using cocaine " heavily" over the past week just prior to this admission.I was concerned about potential neurologic changes due to hypertension and came to assess the patient for this. Vital signs.temperature 98.4, pulse 82, respiration 20, blood pressure 179/103. General appearance. Well-developed middle-aged male. Cardiac. Regular rate and rhythm. Lungs. Breath sounds clear and equal. Abdomen. Soft with positive bowel sounds. Neurologic. Cranial nerves II through XII grossly intact. No unilateral or focal defects found. Impression/plan. Problem #1. Hypertensive urgency. It appears as if the increased hydralazine has improved his blood pressure significantly. We'll continue with the current dosing and administration. Problem #2. Possible cocaine withdrawal. This leading to hypertensive urgency, anxiety, nausea, diaphoresis. I will give the patient a dose of Ativan 1 mg IV now. I ordered a urine drug screen. We'll defer further management to the rounding physician.

## 2014-02-13 NOTE — Progress Notes (Signed)
Echocardiogram 2D Echocardiogram has been performed.  Marc Mata 02/13/2014, 11:28 AM

## 2014-02-13 NOTE — Progress Notes (Signed)
24 hour urine collection in progress

## 2014-02-13 NOTE — Progress Notes (Signed)
Pt called into room and was nauseous again and vomited. Pt was up in room. Got very anxious. HR jumped up to 150 for a min. Charge nurse in room. Encouraged pt to lie back down and  Relax and calm down. Pt very anxious. Pt finally layed down and relax. BP 200/97. Pt told charge nurse that he did cocaine yesterday and had been doing it for a while. Encouraged pt to stay in bed and relax. Talked with T. Claiborne Billingsallahan of [facility]. Made him aware of above and of pt saying he did cocaine. Order for Catapres PO. Will give as soon as possible.

## 2014-02-13 NOTE — Progress Notes (Signed)
UR completed 

## 2014-02-13 NOTE — Plan of Care (Signed)
Problem: Phase I Progression Outcomes Goal: OOB as tolerated unless otherwise ordered Outcome: Completed/Met Date Met:  02/13/14 Goal: Hemodynamically stable Outcome: Completed/Met Date Met:  02/13/14     

## 2014-02-14 LAB — HIV ANTIBODY (ROUTINE TESTING W REFLEX): HIV: NONREACTIVE

## 2014-02-14 LAB — PROTEIN, URINE, 24 HOUR
COLLECTION INTERVAL-UPROT: 24 h
PROTEIN 24H UR: 11109 mg/d — AB (ref 50–100)
Protein, Urine: 529 mg/dL
Urine Total Volume-UPROT: 2100 mL

## 2014-02-14 NOTE — Progress Notes (Signed)
PROGRESS NOTE    Marc Mata ZOX:[id number] DOB: 22-Feb-1976 DOA: 02/12/2014 PCP: No PCP Per Patient  HPI/Brief narrative 38 year old male patient with history of HTN, noncompliant with medications or M.D. follow-ups, polysubstance abuse - tobacco, THC and cocaine presented to ED with complaints of bilateral leg edema, transient chest discomfort in ED which resolved spontaneously and was not associated with dyspnea and was found to have markedly elevated blood pressures.   Assessment/Plan:  1. Hypertensive urgency/accelerated hypertension: Secondary to medication noncompliance and ongoing cocaine abuse. Patient was counseled extensively in the presence of his nurse regarding abstinence from substance abuse. He verbalized understanding. Continue HCTZ.  Added Clonidine. Better controlled. ? Add ACEI d/t proteinuria. 2. Bilateral leg edema: Unclear etiology. R/o Nephrotic range proteinuria. Hypoalbuminemia contributing. Patient does complain of some DOE but no other features of CHF at this time. Continue HCTZ. proBNP normal. 2-D echo: Mild LVH, LVEF 65-70 percent and grade 1 diastolic dysfunction. Improved/resolved.  3. 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 Recommend adequate blood pressure control and monitor renal functions closely as outpatient. Will check 24-hour urine protein->300 mg per DL of protein on urine microscopy in May 2015. Rule out nephrotic range proteinuria. HIV - negative. Completes 24 hour urine collection this PM. Lipids not markedly abnormal. 4. Polysubstance abuse-tobacco, THC and cocaine: Cessation counseled. 5. Medication noncompliance: Counseled extensively. 6. Proteinuria: Management as above 7. Homeless: CSW input and assistance appreciated.   Code Status: full Family Communication: none at bedside. Disposition Plan: home possibly 11/11   Consultants:  none  Procedures:  none  Antibiotics:  2-D echo  02/13/14: Study Conclusions  - Left ventricle: The cavity size was normal. Wall thickness was increased in a pattern of mild LVH. Systolic function was vigorous. The estimated ejection fraction was in the range of 65% to 70%. Wall motion was normal; there were no regional wall motion abnormalities. Doppler parameters are consistent with abnormal left ventricular relaxation (grade 1 diastolic dysfunction).  Subjective: No Headaches. Denies chest pain or dyspnea. Leg swelling resolved.  Objective: Filed Vitals:   02/14/14 0345 02/14/14 1112 02/14/14 1224 02/14/14 1339  BP: 152/89 168/100 178/92 150/93  Pulse: 82  60 86  Temp: 98.8 F (37.1 C)   98.1 F (36.7 C)  TempSrc: Oral   Oral  Resp: 20   20  SpO2: 100%   100%    Intake/Output Summary (Last 24 hours) at 02/14/14 1645 Last data filed at 02/14/14 1530  Gross per 24 hour  Intake   1320 ml  Output   1551 ml  Net   -231 ml   There were no vitals filed for this visit.   Exam:  General exam: pleasant young male lying comfortably supine in bed. Multiple tattoos. Respiratory system: Clear. No increased work of breathing. Cardiovascular system: S1 & S2 heard, RRR. No JVD, murmurs, gallops, clicks. No leg edema. Non tele. Gastrointestinal system: Abdomen is nondistended, soft and nontender. Normal bowel sounds heard. Central nervous system: Alert and oriented. No focal neurological deficits. Extremities: Symmetric 5 x 5 power.   Data Reviewed: Basic Metabolic Panel:  Recent Labs Lab 02/12/14 1221 02/12/14 1223 02/12/14 1715 02/13/14 0345  NA 138 143  --  140  K 4.5 4.6  --  4.3  CL 108 105  --  101  CO2  --  27  --  29  GLUCOSE 72 78  --  80  BUN 16 15  --  12  CREATININE 1.40* 1.26 1.24 1.18  CALCIUM  --  8.5  --  9.2   Liver Function Tests:  Recent Labs Lab 02/12/14 1223  AST 23  ALT 13  ALKPHOS 52  BILITOT <0.2*  PROT 5.3*  ALBUMIN 2.2*   No results for input(s): LIPASE, AMYLASE in  the last 168 hours. No results for input(s): AMMONIA in the last 168 hours. CBC:  Recent Labs Lab 02/12/14 1221 02/12/14 1223 02/12/14 1715 02/13/14 0345  WBC  --  4.2 5.7 5.4  NEUTROABS  --  1.6*  --   --   HGB 12.9* 13.5 13.7 15.4  HCT 38.0* 39.0 39.4 43.6  MCV  --  84.8 84.2 83.7  PLT  --  163 172 167   Cardiac Enzymes:  Recent Labs Lab 02/12/14 1223  TROPONINI <0.30   BNP (last 3 results)  Recent Labs  03/05/13 0946 02/12/14 1223  PROBNP 77.9 47.9   CBG: No results for input(s): GLUCAP in the last 168 hours.  No results found for this or any previous visit (from the past 240 hour(s)).    Additional labs: 1. UDS positive for cocaine and THC.     Studies: No results found.      Scheduled Meds: . cloNIDine  0.1 mg Oral BID  . diclofenac sodium  2 g Topical QID  . enoxaparin (LOVENOX) injection  40 mg Subcutaneous Q24H  . hydrochlorothiazide  25 mg Oral Daily  . sodium chloride  3 mL Intravenous Q12H   Continuous Infusions:   Active Problems:   Hypertensive emergency   Acute renal failure    Time spent: 35 minutes.    Marcellus ScottHONGALGI,Zanaya Baize, MD, FACP, FHM. [facility] Pager [phone]  If 7PM-7AM, please contact night-coverage [email or web address] Password [email or web address] 02/14/2014, 4:45 PM    LOS: 2 days

## 2014-02-14 NOTE — Progress Notes (Signed)
Agree with student assessment. Pt denies needs at present. Did discuss drug use with pt and kidney function testing. The importance of getting the 24 hr urine study. No ss of distress noted.

## 2014-02-14 NOTE — Progress Notes (Signed)
UR completed 

## 2014-02-14 NOTE — Clinical Social Work Note (Addendum)
Social work received consult for patient who states he is homeless and also had some cocaine use.  Patient stated he has been under a lot of stress the past few months, because he has lost some loved ones, his job, and his car.  Patient stated he has tried applying for disability but has been unsuccessful in getting approved.  Patient stated he did not want to talk anymore about the recent struggles he has had.  Patient states he does not have any other friends or family that he can stay with.  Patient was given a list of homeless shelters and substance abuse treatment if he decides he needs to get help.  Resources given to patient, CSW to sign off reconsult if other social work needs arise.  Ervin KnackEric R. Yocelin Vanlue, MSW, Theresia MajorsLCSWA [phone] 02/14/2014 4:38 PM

## 2014-02-14 NOTE — Care Management Note (Unsigned)
    Page 1 of 1   02/14/2014     4:21:23 PM CARE MANAGEMENT NOTE 02/14/2014  Patient:  Marc Mata,Marc Mata   Account Number:  [id number]  Date Initiated:  02/14/2014  Documentation initiated by:  Roselyne Stalnaker  Subjective/Objective Assessment:   Pt adm on 02/12/14 with HTN, cocaine abuse, leg edema.  PTA, pt independent, and reportedly homeless.     Action/Plan:   CSW consulted for PSA and homeless issues.  Pt has no PCP; follow up appt made for 02/16/14 at 9:00am.  Information put on AVS in EPIC.   Anticipated DC Date:  02/15/2014   Anticipated DC Plan:  HOME/SELF CARE  In-house referral  Clinical Social Worker      DC Planning Services  CM consult  [facility]      Choice offered to / List presented to:             Status of service:  In process, will continue to follow Medicare Important Message given?   (If response is "NO", the following Medicare IM given date fields will be blank) Date Medicare IM given:   Medicare IM given by:   Date Additional Medicare IM given:   Additional Medicare IM given by:    Discharge Disposition:    Per UR Regulation:  Reviewed for med. necessity/level of care/duration of stay  If discussed at Long Length of Stay Meetings, dates discussed:    Comments:

## 2014-02-14 NOTE — Clinical Social Work Psychosocial (Signed)
Clinical Social Work Department BRIEF PSYCHOSOCIAL ASSESSMENT 02/14/2014  Patient:  Marc Mata,Marc Mata     Account Number:  [id number]     Admit date:  02/12/2014  Clinical Social Worker:  Elouise MunroeANTERHAUS,Insiya Oshea, LCSWA  Date/Time:  02/14/2014 04:40 PM  Referred by:  RN  Date Referred:  02/14/2014 Referred for  Homelessness  Substance Abuse   Other Referral:   Interview type:  Patient Other interview type:    PSYCHOSOCIAL DATA Living Status:  OTHER Admitted from facility:   Level of care:   Primary support name:  None Primary support relationship to patient:   Degree of support available:   No other support    CURRENT CONCERNS  Other Concerns:    SOCIAL WORK ASSESSMENT / PLAN Patient is a 38 year old male who is now homeless.  Patient had some substance abuse issues.  Patient is alert and oriented x3, and not very talkative.  Patient states he has lost his job, home, his mother, and his car within the last couple of months.  Patient states he has tried to apply for disability and medicaid, but has not been successful. Patient states he can not get food stamps either.   Assessment/plan status:  [facility] Other assessment/ plan:   Patient was given information about homeless shelters in [place], and also given information about substance abuse help.   Information/referral to community resources:    PATIENT'S/FAMILY'S RESPONSE TO PLAN OF CARE: Patient accepted list of facilities.   Marc Mata, MSW, Marc Mata [phone] 02/14/2014 4:46 PM

## 2014-02-15 LAB — COMPREHENSIVE METABOLIC PANEL
ALBUMIN: 2.3 g/dL — AB (ref 3.5–5.2)
ALT: 14 U/L (ref 0–53)
ANION GAP: 9 (ref 5–15)
AST: 18 U/L (ref 0–37)
Alkaline Phosphatase: 56 U/L (ref 39–117)
BUN: 17 mg/dL (ref 6–23)
CO2: 27 mEq/L (ref 19–32)
CREATININE: 1.19 mg/dL (ref 0.50–1.35)
Calcium: 8.6 mg/dL (ref 8.4–10.5)
Chloride: 100 mEq/L (ref 96–112)
GFR calc Af Amer: 89 mL/min — ABNORMAL LOW (ref >90)
GFR calc non Af Amer: 77 mL/min — ABNORMAL LOW (ref >90)
Glucose, Bld: 90 mg/dL (ref 70–99)
Potassium: 4.2 mEq/L (ref 3.7–5.3)
Sodium: 136 mEq/L — ABNORMAL LOW (ref 137–147)
TOTAL PROTEIN: 5.7 g/dL — AB (ref 6.0–8.3)
Total Bilirubin: 0.4 mg/dL (ref 0.3–1.2)

## 2014-02-15 MED ORDER — LISINOPRIL 20 MG PO TABS: 20.0000 mg | ORAL_TABLET | Freq: Every day | ORAL | Status: DC

## 2014-02-15 MED ORDER — HYDROCHLOROTHIAZIDE 25 MG PO TABS: 25.0000 mg | ORAL_TABLET | Freq: Every day | ORAL | Status: DC

## 2014-02-15 MED ORDER — LISINOPRIL 20 MG PO TABS
20.0000 mg | ORAL_TABLET | Freq: Every day | ORAL | Status: DC
  Administered 2014-02-15: 20 mg via ORAL
  Filled 2014-02-15: qty 1

## 2014-02-15 NOTE — Progress Notes (Signed)
Pt discharged with self care Discharge instructions given & reviewed Eduction discussed  IV dc'd  Tele dc'd  Pt discharged request to ambulate himself, no wheelchair needed. all pt belongs at side. Louie BunWilson,Lucillia Corson S .11:02 AM

## 2014-02-15 NOTE — Discharge Summary (Signed)
Physician Discharge Summary  Marc Mata MRN: [id number] DOB/AGE: [birth year] 37 y.o.  PCP: No PCP Per Patient   Admit date: 02/12/2014 Discharge date: 02/15/2014  Discharge Diagnoses:     Hypertensive emergency   Acute renal failure Nephrotic range proteinuria Polysubstance abuse including cocaine and marijuana   Follow-up recommendations Follow-up with PCP as scheduled Follow-up BMP in one week    Medication List    TAKE these medications        hydrochlorothiazide 25 MG tablet  Commonly known as:  HYDRODIURIL  Take 1 tablet (25 mg total) by mouth daily.     lisinopril 20 MG tablet  Commonly known as:  PRINIVIL,ZESTRIL  Take 1 tablet (20 mg total) by mouth daily.        Discharge Condition:stable  Disposition: 01-Home or Self Care   Consults: none   Significant Diagnostic Studies: Dg Chest 2 View  02/12/2014   CLINICAL DATA:  38 year old male with left-sided chest pain  EXAM: CHEST  2 VIEW  COMPARISON:  Prior chest x-ray 03/05/2013  FINDINGS: The lungs are clear and negative for focal airspace consolidation, pulmonary edema or suspicious pulmonary nodule. No pleural effusion or pneumothorax. Cardiac and mediastinal contours are within normal limits. No acute fracture or lytic or blastic osseous lesions. The visualized upper abdominal bowel gas pattern is unremarkable. Bolus shaped metallic radiopacity noted in the soft tissues posterior to T12.  IMPRESSION: No active cardiopulmonary disease.   Electronically Signed   By: Jacqulynn Cadet M.D.   On: 02/12/2014 13:21      Microbiology: No results found for this or any previous visit (from the past 240 hour(s)).   Labs: Results for orders placed or performed during the hospital encounter of 02/12/14 (from the past 48 hour(s))  Lipid panel     Status: Abnormal   Collection Time: 02/13/14  2:35 PM  Result Value Ref Range   Cholesterol 262 (H) 0 - 200 mg/dL   Triglycerides 87 <150 mg/dL   HDL 73 >39  mg/dL   Total CHOL/HDL Ratio 3.6 RATIO   VLDL 17 0 - 40 mg/dL   LDL Cholesterol 172 (H) 0 - 99 mg/dL    Comment:        Total Cholesterol/HDL:CHD Risk Coronary Heart Disease Risk Table                     Men   Women  1/2 Average Risk   3.4   3.3  Average Risk       5.0   4.4  2 X Average Risk   9.6   7.1  3 X Average Risk  23.4   11.0        Use the calculated Patient Ratio above and the CHD Risk Table to determine the patient's CHD Risk.        ATP III CLASSIFICATION (LDL):  <100     mg/dL   Optimal  100-129  mg/dL   Near or Above                    Optimal  130-159  mg/dL   Borderline  160-189  mg/dL   High  >190     mg/dL   Very High   HIV antibody     Status: None   Collection Time: 02/13/14  2:35 PM  Result Value Ref Range   HIV 1&2 Ab, 4th Generation NONREACTIVE NONREACTIVE    Comment: (NOTE) A NONREACTIVE HIV Ag/Ab  result does not exclude HIV infection since the time frame for seroconversion is variable. If acute HIV infection is suspected, a HIV-1 RNA Qualitative TMA test is recommended. HIV-1/2 Antibody Diff         Not indicated. HIV-1 RNA, Qual TMA           Not indicated. PLEASE NOTE: This information has been disclosed to you from records whose confidentiality may be protected by state law. If your state requires such protection, then the state law prohibits you from making any further disclosure of the information without the specific written consent of the person to whom it pertains, or as otherwise permitted by law. A general authorization for the release of medical or other information is NOT sufficient for this purpose. The performance of this assay has not been clinically validated in patients less than 22 years old. Performed at [facility], urine, 24 hour     Status: Abnormal   Collection Time: 02/13/14  6:30 PM  Result Value Ref Range   Urine Total Volume-UPROT 2100 mL   Collection Interval-UPROT 24 hours   Protein, Urine 529  mg/dL   Protein, 24H Urine 11109 (H) 50 - 100 mg/day  Comprehensive metabolic panel     Status: Abnormal   Collection Time: 02/15/14  3:42 AM  Result Value Ref Range   Sodium 136 (L) 137 - 147 mEq/L   Potassium 4.2 3.7 - 5.3 mEq/L   Chloride 100 96 - 112 mEq/L   CO2 27 19 - 32 mEq/L   Glucose, Bld 90 70 - 99 mg/dL   BUN 17 6 - 23 mg/dL   Creatinine, Ser 1.19 0.50 - 1.35 mg/dL   Calcium 8.6 8.4 - 10.5 mg/dL   Total Protein 5.7 (L) 6.0 - 8.3 g/dL   Albumin 2.3 (L) 3.5 - 5.2 g/dL   AST 18 0 - 37 U/L   ALT 14 0 - 53 U/L   Alkaline Phosphatase 56 39 - 117 U/L   Total Bilirubin 0.4 0.3 - 1.2 mg/dL   GFR calc non Af Amer 77 (L) >90 mL/min   GFR calc Af Amer 89 (L) >90 mL/min    Comment: (NOTE) The eGFR has been calculated using the CKD EPI equation. This calculation has not been validated in all clinical situations. eGFR's persistently <90 mL/min signify possible Chronic Kidney Disease.    Anion gap 9 5 - 15     HPI/Brief narrative 38 year old male patient with history of HTN, noncompliant with medications or M.D. follow-ups, polysubstance abuse - tobacco, THC and cocaine presented to ED with complaints of bilateral leg edema, transient chest discomfort in ED which resolved spontaneously and was not associated with dyspnea and was found to have markedly elevated blood pressures.   Assessment/Plan:  1. Hypertensive urgency/accelerated hypertension: Secondary to medication noncompliance and ongoing cocaine abuse. Patient was counseled extensively in the presence of his nurse regarding abstinence from substance abuse. He verbalized understanding. In previously on HCTZ which was continued. Added lisinopril given nephrotic range proteinuria. Patient was found to have 11 g of urine protein in a 24-hour period. Patient may require frequent monitoring of his BMP, possible workup for secondary hypertension including screening tests for pheochromocytoma , adrenal adenoma   Bilateral leg  edema: Unclear etiology.  Nephrotic range proteinuria. Hypoalbuminemia contributing. Patient does complain of some DOE but no other features of CHF at this time. . proBNP normal. 2-D echo: Mild LVH, LVEF 65-70 percent and grade 1 diastolic dysfunction.  He may benefit from addition of a diuretic if bilateral edema persists  2. 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 Recommend adequate blood pressure control and monitor renal functions closely as outpatient.   24-hour urine protein showed->11109 mg per  of protein in a 24-hour period.  Marland Kitchen HIV - negative. LDL 172   3. Polysubstance abuse-tobacco, THC and cocaine: Cessation counseled. 4. Medication noncompliance: Counseled extensively. 5. Proteinuria: Management as above. Started on an ACE inhibitor 6. Homeless: CSW input and assistance appreciated.   Code Status: full Family Communication: none at bedside. Disposition Plan: home possibly 11/11, follow up with community health and wellness clinic   Consultants:  none  Procedures:  none  Antibiotics:  2-D echo 02/13/14: Study Conclusions  - Left ventricle: The cavity size was normal. Wall thickness was increased in a pattern of mild LVH. Systolic function was vigorous. The estimated ejection fraction was in the range of 65% to 70%. Wall motion was normal; there were no regional wall motion abnormalities. Doppler parameters are consistent with abnormal left ventricular relaxation (grade 1 diastolic dysfunction).    Discharge Exam:   Blood pressure 182/103, pulse 74, temperature 98.4 F (36.9 C), temperature source Oral, resp. rate 22, SpO2 100 %. General exam: pleasant young male lying comfortably supine in bed. Multiple tattoos. Respiratory system: Clear. No increased work of breathing. Cardiovascular system: S1 & S2 heard, RRR. No JVD, murmurs, gallops, clicks. No leg edema. Non tele. Gastrointestinal system:  Abdomen is nondistended, soft and nontender. Normal bowel sounds heard. Central nervous system: Alert and oriented. No focal neurological deficits. Extremities: Symmetric 5 x 5 power.        Discharge Instructions    Diet - low sodium heart healthy    Complete by:  As directed      Increase activity slowly    Complete by:  As directed            Follow-up Information    Follow up with [facility]     On 02/16/2014.   Why:  9:00am; please bring photo ID and all medications you are currently taking to your appt.    Contact information:   [place] [address] [phone]      Signed: Reyne Dumas 02/15/2014, 10:28 AM

## 2014-02-15 NOTE — Progress Notes (Signed)
Okay per Dr. Susie CassetteAbrol to dc pt iv. Louie BunWilson,Kailyn Vanderslice S 10:07 AM

## 2014-02-16 ENCOUNTER — Encounter: Payer: Self-pay | Admitting: Family Medicine

## 2014-02-16 ENCOUNTER — Ambulatory Visit (HOSPITAL_BASED_OUTPATIENT_CLINIC_OR_DEPARTMENT_OTHER): Payer: MEDICAID | Admitting: *Deleted

## 2014-02-16 ENCOUNTER — Ambulatory Visit: Payer: Self-pay | Attending: Family Medicine | Admitting: Family Medicine

## 2014-02-16 VITALS — BP 107/68 | HR 60 | Temp 98.1°F | Resp 16 | Ht 69.0 in | Wt 177.0 lb

## 2014-02-16 DIAGNOSIS — Z5900 Homelessness unspecified: Secondary | ICD-10-CM | POA: Insufficient documentation

## 2014-02-16 DIAGNOSIS — I129 Hypertensive chronic kidney disease with stage 1 through stage 4 chronic kidney disease, or unspecified chronic kidney disease: Secondary | ICD-10-CM | POA: Insufficient documentation

## 2014-02-16 DIAGNOSIS — F17200 Nicotine dependence, unspecified, uncomplicated: Secondary | ICD-10-CM | POA: Insufficient documentation

## 2014-02-16 DIAGNOSIS — I161 Hypertensive emergency: Secondary | ICD-10-CM

## 2014-02-16 DIAGNOSIS — I10 Essential (primary) hypertension: Secondary | ICD-10-CM | POA: Insufficient documentation

## 2014-02-16 DIAGNOSIS — F1210 Cannabis abuse, uncomplicated: Secondary | ICD-10-CM

## 2014-02-16 DIAGNOSIS — Z23 Encounter for immunization: Secondary | ICD-10-CM

## 2014-02-16 DIAGNOSIS — F1410 Cocaine abuse, uncomplicated: Secondary | ICD-10-CM | POA: Insufficient documentation

## 2014-02-16 DIAGNOSIS — N179 Acute kidney failure, unspecified: Secondary | ICD-10-CM | POA: Insufficient documentation

## 2014-02-16 DIAGNOSIS — F1290 Cannabis use, unspecified, uncomplicated: Secondary | ICD-10-CM | POA: Insufficient documentation

## 2014-02-16 MED ORDER — HYDROCHLOROTHIAZIDE 25 MG PO TABS: 25.0000 mg | ORAL_TABLET | Freq: Every day | ORAL | Status: DC

## 2014-02-16 MED ORDER — LISINOPRIL 20 MG PO TABS: 20.0000 mg | ORAL_TABLET | Freq: Every day | ORAL | Status: DC

## 2014-02-16 NOTE — Progress Notes (Signed)
LCSW met with patient in order to initiate contact. LCSW provided patient with bus pass and contact information for follow up meeting.  LCSW will meet with patient within 1 week.  Christene Lye MSW, LCSW

## 2014-02-16 NOTE — Patient Instructions (Signed)
Mr. Marc Mata,  Thank you for coming in today. It was a pleasure meeting you. I look forward to being your primary doctor.  1. HTN: well controlled. Continue current medication.  F/u in 2-3 weeks will get repeat BMP then.  Dr. Armen PickupFunches    Apply for: PASS, [place] discount, orange card.

## 2014-02-16 NOTE — Assessment & Plan Note (Signed)
A: well controlled Meds: complaint P: 3 month supply patient to apply for PASS F/u Cr in 2 weeks

## 2014-02-16 NOTE — Assessment & Plan Note (Signed)
Resolved. F/u BMP in 2 weeks

## 2014-02-16 NOTE — Progress Notes (Signed)
Establish Care HFU Pt was in the hospital due to High Bood Pressure Complaining of pain on top of head and Rt arm

## 2014-02-16 NOTE — Assessment & Plan Note (Signed)
Social work referral

## 2014-02-16 NOTE — Progress Notes (Signed)
   Subjective:    Patient ID: Juanell FairlyAcey Nilan, male    DOB: 18-Aug-1975, 38 y.o.   MRN: [id number] CC: establish care, HFU for hypertensive emergency with acute renal failure. In the setting of cocaine abuse.  HPI 38 yo M with hx of HTN dx in 2007  1. HTN emergency: discharged from hospital on 02/15/14. Admits to CO with exertion. No SOB or leg edema. Quit cocaine and tobacco. Still smoking some marijuana.   Soc Hx: homeless   Fam hx:  HTN in mother  Med Hx: htn dx in 2007  Review of Systems As per HPI     Objective:   Physical Exam BP 107/68 mmHg  Pulse 60  Temp(Src) 98.1 F (36.7 C) (Oral)  Resp 16  Ht 5\' 9"  (1.753 m)  Wt 177 lb (80.287 kg)  BMI 26.13 kg/m2  SpO2 100% General appearance: alert, cooperative and no distress Lungs: clear to auscultation bilaterally Heart: regular rate and rhythm, S1, S2 normal, no murmur, click, rub or gallop  Ext: no edema      Assessment & Plan:

## 2014-02-17 ENCOUNTER — Inpatient Hospital Stay: Payer: Self-pay | Admitting: Family Medicine

## 2014-02-23 ENCOUNTER — Other Ambulatory Visit: Payer: Self-pay

## 2014-03-09 ENCOUNTER — Ambulatory Visit: Payer: Self-pay | Attending: Family Medicine

## 2014-03-09 DIAGNOSIS — N179 Acute kidney failure, unspecified: Secondary | ICD-10-CM

## 2014-03-09 LAB — BASIC METABOLIC PANEL
BUN: 21 mg/dL (ref 6–23)
CALCIUM: 9 mg/dL (ref 8.4–10.5)
CO2: 27 mEq/L (ref 19–32)
Chloride: 104 mEq/L (ref 96–112)
Creat: 1.16 mg/dL (ref 0.50–1.35)
GLUCOSE: 87 mg/dL (ref 70–99)
POTASSIUM: 4.6 meq/L (ref 3.5–5.3)
SODIUM: 139 meq/L (ref 135–145)

## 2014-03-13 ENCOUNTER — Telehealth: Payer: Self-pay | Admitting: Family Medicine

## 2014-03-13 NOTE — Telephone Encounter (Signed)
Patient calling to request medication refill for all meds (HTN). Please follow up.

## 2014-03-15 ENCOUNTER — Encounter: Payer: Self-pay | Admitting: Family Medicine

## 2014-03-15 ENCOUNTER — Ambulatory Visit: Payer: Self-pay | Attending: Family Medicine | Admitting: Family Medicine

## 2014-03-15 VITALS — BP 119/67 | HR 72 | Temp 98.8°F | Resp 18 | Ht 69.0 in | Wt 158.0 lb

## 2014-03-15 DIAGNOSIS — F17200 Nicotine dependence, unspecified, uncomplicated: Secondary | ICD-10-CM | POA: Insufficient documentation

## 2014-03-15 DIAGNOSIS — I10 Essential (primary) hypertension: Secondary | ICD-10-CM

## 2014-03-15 DIAGNOSIS — N179 Acute kidney failure, unspecified: Secondary | ICD-10-CM

## 2014-03-15 DIAGNOSIS — R200 Anesthesia of skin: Secondary | ICD-10-CM

## 2014-03-15 DIAGNOSIS — W3400XS Accidental discharge from unspecified firearms or gun, sequela: Secondary | ICD-10-CM | POA: Insufficient documentation

## 2014-03-15 DIAGNOSIS — M79601 Pain in right arm: Secondary | ICD-10-CM | POA: Insufficient documentation

## 2014-03-15 DIAGNOSIS — F1410 Cocaine abuse, uncomplicated: Secondary | ICD-10-CM

## 2014-03-15 DIAGNOSIS — F1490 Cocaine use, unspecified, uncomplicated: Secondary | ICD-10-CM | POA: Insufficient documentation

## 2014-03-15 DIAGNOSIS — R21 Rash and other nonspecific skin eruption: Secondary | ICD-10-CM

## 2014-03-15 DIAGNOSIS — M67921 Unspecified disorder of synovium and tendon, right upper arm: Secondary | ICD-10-CM

## 2014-03-15 DIAGNOSIS — F1290 Cannabis use, unspecified, uncomplicated: Secondary | ICD-10-CM | POA: Insufficient documentation

## 2014-03-15 MED ORDER — MELOXICAM 15 MG PO TABS: 15.0000 mg | ORAL_TABLET | Freq: Every day | ORAL | Status: DC

## 2014-03-15 MED ORDER — GABAPENTIN 300 MG PO CAPS: 300.0000 mg | ORAL_CAPSULE | Freq: Two times a day (BID) | ORAL | Status: DC | PRN

## 2014-03-15 MED ORDER — TRIAMCINOLONE ACETONIDE 0.1 % EX CREA: 1.0000 "application " | TOPICAL_CREAM | Freq: Two times a day (BID) | CUTANEOUS | Status: DC

## 2014-03-15 MED ORDER — LISINOPRIL 20 MG PO TABS: 20.0000 mg | ORAL_TABLET | Freq: Every day | ORAL | Status: DC

## 2014-03-15 MED ORDER — HYDROCHLOROTHIAZIDE 25 MG PO TABS: 25.0000 mg | ORAL_TABLET | Freq: Every day | ORAL | Status: DC

## 2014-03-15 NOTE — Assessment & Plan Note (Signed)
R arm pain: suspect bicep tendon strain or partial tear.  Use antiinflammatory, mobic,  daily for next 1-2 weeks with food.

## 2014-03-15 NOTE — Assessment & Plan Note (Signed)
Back rash: I suspect some inflammation related to old trauma vs skin fungus. Use steroid cream if rash worsens stop cream immediately.
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
## 2014-03-15 NOTE — Assessment & Plan Note (Signed)
Resolved

## 2014-03-15 NOTE — Patient Instructions (Addendum)
Mr. Gwenevere AbbotMorton,  Thank you for coming in today.  1. Back rash: I suspect some inflammation related to old trauma vs skin fungus. Use steroid cream if rash worsens stop cream immediately.  2. R arm pain: suspect bicep tendon strain or partial tear.  Use antiinflammatory, mobic,  daily for next 1-2 weeks with food.   3. R sided low back numbness: Gabapentin 300 mg at nigh for 3 days if needed add a daytime dose if needed add another daytime dose after 3 more days   F/u in 4 weeks  Be sure to apply for PASS.   Dr. Armen PickupFunches

## 2014-03-15 NOTE — Assessment & Plan Note (Signed)
R sided low back numbness: disc disease vs spondylithesis  Gabapentin 300 mg at nigh for 3 days if needed add a daytime dose if needed add another daytime dose after 3 more days  Lumbar x-ray
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
## 2014-03-17 ENCOUNTER — Telehealth: Payer: Self-pay | Admitting: *Deleted

## 2014-03-17 NOTE — Telephone Encounter (Signed)
Pt aware, no need for apt for X- Ray. Will try to go today  Please call patient and inform him that he can go for lumbar x-ray anytime.

## 2014-04-12 ENCOUNTER — Telehealth: Payer: Self-pay | Admitting: Family Medicine

## 2014-04-12 ENCOUNTER — Ambulatory Visit: Payer: Self-pay

## 2014-04-12 NOTE — Telephone Encounter (Signed)
Patient came into facility stating that the current medications he is on is making him numb on his upper back, patient states that he is not sure what medication is giving him the symptoms. Please f/u with pt.

## 2014-04-18 ENCOUNTER — Encounter (HOSPITAL_COMMUNITY): Payer: Self-pay

## 2014-04-18 ENCOUNTER — Ambulatory Visit: Payer: Self-pay | Admitting: Family Medicine

## 2014-04-18 ENCOUNTER — Emergency Department (HOSPITAL_COMMUNITY)
Admission: EM | Admit: 2014-04-18 | Discharge: 2014-04-18 | Disposition: A | Discharge status: Home / Self Care | Payer: MEDICAID | Attending: Emergency Medicine | Admitting: Emergency Medicine

## 2014-04-18 DIAGNOSIS — K047 Periapical abscess without sinus: Secondary | ICD-10-CM | POA: Insufficient documentation

## 2014-04-18 DIAGNOSIS — Z791 Long term (current) use of non-steroidal anti-inflammatories (NSAID): Secondary | ICD-10-CM | POA: Insufficient documentation

## 2014-04-18 DIAGNOSIS — Z79899 Other long term (current) drug therapy: Secondary | ICD-10-CM | POA: Insufficient documentation

## 2014-04-18 DIAGNOSIS — Z720 Tobacco use: Secondary | ICD-10-CM | POA: Insufficient documentation

## 2014-04-18 DIAGNOSIS — I10 Essential (primary) hypertension: Secondary | ICD-10-CM | POA: Insufficient documentation

## 2014-04-18 MED ORDER — TRAMADOL HCL 50 MG PO TABS: 50.0000 mg | ORAL_TABLET | Freq: Four times a day (QID) | ORAL | Status: DC | PRN

## 2014-04-18 MED ORDER — TRAMADOL HCL 50 MG PO TABS
50.0000 mg | ORAL_TABLET | Freq: Once | ORAL | Status: AC
  Administered 2014-04-18: 50 mg via ORAL
  Filled 2014-04-18: qty 1

## 2014-04-18 MED ORDER — PENICILLIN V POTASSIUM 250 MG PO TABS
500.0000 mg | ORAL_TABLET | Freq: Once | ORAL | Status: AC
  Administered 2014-04-18: 500 mg via ORAL
  Filled 2014-04-18: qty 2

## 2014-04-18 MED ORDER — PENICILLIN V POTASSIUM 500 MG PO TABS: 500.0000 mg | ORAL_TABLET | Freq: Four times a day (QID) | ORAL | Status: DC

## 2014-04-18 NOTE — ED Notes (Signed)
Per ems: pt from home with complaints of right upper molar tooth ache for 2 weeks. No swelling to face or neck noted. Airway intact. Pain 10/10. He states the tooth is loose and wants to pull it. The molar tooth beside the affected tooth is missing and he said he pulled that one last week.

## 2014-04-18 NOTE — ED Provider Notes (Signed)
CSN: [id number]     Arrival date & time 04/18/14  0505 History   First MD Initiated Contact with Patient 04/18/14 0507     Chief Complaint  Patient presents with  . Dental Pain     (Consider location/radiation/quality/duration/timing/severity/associated sxs/prior Treatment) Patient is a 39 y.o. male presenting with tooth pain. The history is provided by the patient.  Dental Pain Location:  Upper Upper teeth location:  2/RU 2nd molar and 3/RU 1st molar Quality:  Dull and pulsating Severity:  Moderate Onset quality:  Sudden Duration:  3 days Timing:  Constant Progression:  Worsening Chronicity:  New Context: abscess and poor dentition   Context comment:  Right upper second molar broke off 2 days ago, leaving the root in the gum Relieved by:  NSAIDs Worsened by:  Hot food/drink and cold food/drink Associated symptoms: no facial swelling, no fever and no headaches     Past Medical History  Diagnosis Date  . Hypertension Dx 2007   Past Surgical History  Procedure Laterality Date  . Appendectomy  1996   Family History  Problem Relation Age of Onset  . Hypertension Mother   . Heart disease Mother   . Cancer Father   . Diabetes Sister   . Hypertension Brother    History  Substance Use Topics  . Smoking status: Current Every Day Smoker  . Smokeless tobacco: Never Used  . Alcohol Use: 0.0 oz/week    0 Not specified per week     Comment: ocassionally    Review of Systems  Constitutional: Negative for fever.  HENT: Positive for dental problem. Negative for facial swelling.   Neurological: Negative for headaches.      Allergies  Review of patient's allergies indicates no known allergies.  Home Medications   Prior to Admission medications   Medication Sig Start Date End Date Taking? Authorizing Provider  gabapentin (NEURONTIN) 300 MG capsule Take 1 capsule (300 mg total) by mouth 3 times/day as needed-between meals & bedtime. 03/15/14   Josalyn C Funches, MD    hydrochlorothiazide (HYDRODIURIL) 25 MG tablet Take 1 tablet (25 mg total) by mouth daily. 03/15/14   Josalyn C Funches, MD  lisinopril (PRINIVIL,ZESTRIL) 20 MG tablet Take 1 tablet (20 mg total) by mouth daily. 03/15/14   Lora PaulaJosalyn C Funches, MD  meloxicam (MOBIC) 15 MG tablet Take 1 tablet (15 mg total) by mouth daily. 03/15/14   Josalyn C Funches, MD  penicillin v potassium (VEETID) 500 MG tablet Take 1 tablet (500 mg total) by mouth 4 (four) times daily. 04/18/14   Arman FilterGail K Minor Iden, NP  traMADol (ULTRAM) 50 MG tablet Take 1 tablet (50 mg total) by mouth every 6 (six) hours as needed. 04/18/14   Arman FilterGail K Matrice Herro, NP  triamcinolone cream (KENALOG) 0.1 % Apply 1 application topically 2 (two) times daily. Apply to R upper back 03/15/14   Josalyn C Funches, MD   BP 181/100 mmHg  Pulse 55  Temp(Src) 98.3 F (36.8 C) (Oral)  Resp 18  Ht 5\' 9"  (1.753 m)  Wt 160 lb (72.576 kg)  BMI 23.62 kg/m2  SpO2 100% Physical Exam  Constitutional: He is oriented to person, place, and time. He appears well-developed and well-nourished.  HENT:  Head: Normocephalic.  Right Ear: External ear normal.  Left Ear: External ear normal.  Mouth/Throat:    Eyes: Pupils are equal, round, and reactive to light.  Neck: Normal range of motion.  Cardiovascular: Normal rate and regular rhythm.   Pulmonary/Chest: Effort normal.  Musculoskeletal: Normal range of motion.  Lymphadenopathy:    He has no cervical adenopathy.  Neurological: He is alert and oriented to person, place, and time.  Skin: Skin is warm.    ED Course  Procedures (including critical care time) Labs Review Labs Reviewed - No data to display  Imaging Review No results found.   EKG Interpretation None     Will give PCN and Ultram  And referr to DDS  MDM   Final diagnoses:  Dental abscess         Arman Filter, NP 04/18/14 0525  April K Palumbo-Rasch, MD 04/18/14 [phone]

## 2014-04-18 NOTE — Discharge Instructions (Signed)
Abscessed Tooth An abscessed tooth is an infection around your tooth. It may be caused by holes or damage to the tooth (cavity) or a dental disease. An abscessed tooth causes mild to very bad pain in and around the tooth. See your dentist right away if you have tooth or gum pain. HOME CARE  Take your medicine as told. Finish it even if you start to feel better.  Do not drive after taking pain medicine.  Rinse your mouth (gargle) often with salt water ( teaspoon salt in 8 ounces of warm water).  Do not apply heat to the outside of your face. GET HELP RIGHT AWAY IF:   You have a temperature by mouth above 102 F (38.9 C), not controlled by medicine.  You have chills and a very bad headache.  You have problems breathing or swallowing.  Your mouth will not open.  You develop puffiness (swelling) on the neck or around the eye.  Your pain is not helped by medicine.  Your pain is getting worse instead of better. MAKE SURE YOU:   Understand these instructions.  Will watch your condition.  Will get help right away if you are not doing well or get worse. Document Released: 09/10/2007 Document Revised: 06/16/2011 Document Reviewed: 07/02/2010 [facility] Patient Information 2015 [employer] [place]. This information is not intended to replace advice given to you by your health care provider. Make sure you discuss any questions you have with your health care provider. Call the dentist to set an appointment for definitive care

## 2014-04-20 ENCOUNTER — Ambulatory Visit: Payer: Self-pay | Admitting: Family Medicine

## 2014-04-26 ENCOUNTER — Encounter: Payer: Self-pay | Admitting: *Deleted

## 2014-04-26 NOTE — Telephone Encounter (Signed)
Spoke with patient and [facility] spinal xray completed prior to seeing Dr. Sidney AceFunchess on Monday 05/01/14 at 9:30 am. Patient was given directions and agreed to go.

## 2014-05-01 ENCOUNTER — Ambulatory Visit: Payer: Self-pay | Admitting: Family Medicine

## 2014-05-09 ENCOUNTER — Emergency Department (HOSPITAL_COMMUNITY)
Admission: EM | Admit: 2014-05-09 | Discharge: 2014-05-09 | Disposition: A | Discharge status: Home / Self Care | Payer: MEDICAID | Attending: Emergency Medicine | Admitting: Emergency Medicine

## 2014-05-09 ENCOUNTER — Encounter (HOSPITAL_COMMUNITY): Payer: Self-pay | Admitting: Emergency Medicine

## 2014-05-09 DIAGNOSIS — G479 Sleep disorder, unspecified: Secondary | ICD-10-CM | POA: Insufficient documentation

## 2014-05-09 DIAGNOSIS — K0889 Other specified disorders of teeth and supporting structures: Secondary | ICD-10-CM

## 2014-05-09 DIAGNOSIS — K029 Dental caries, unspecified: Secondary | ICD-10-CM | POA: Insufficient documentation

## 2014-05-09 DIAGNOSIS — Z720 Tobacco use: Secondary | ICD-10-CM | POA: Insufficient documentation

## 2014-05-09 DIAGNOSIS — Z7952 Long term (current) use of systemic steroids: Secondary | ICD-10-CM | POA: Insufficient documentation

## 2014-05-09 DIAGNOSIS — Z79899 Other long term (current) drug therapy: Secondary | ICD-10-CM | POA: Insufficient documentation

## 2014-05-09 DIAGNOSIS — Z792 Long term (current) use of antibiotics: Secondary | ICD-10-CM | POA: Insufficient documentation

## 2014-05-09 DIAGNOSIS — K0381 Cracked tooth: Secondary | ICD-10-CM | POA: Insufficient documentation

## 2014-05-09 DIAGNOSIS — I10 Essential (primary) hypertension: Secondary | ICD-10-CM | POA: Insufficient documentation

## 2014-05-09 DIAGNOSIS — Z791 Long term (current) use of non-steroidal anti-inflammatories (NSAID): Secondary | ICD-10-CM | POA: Insufficient documentation

## 2014-05-09 DIAGNOSIS — K088 Other specified disorders of teeth and supporting structures: Secondary | ICD-10-CM | POA: Insufficient documentation

## 2014-05-09 MED ORDER — HYDROCODONE-ACETAMINOPHEN 5-325 MG PO TABS
2.0000 | ORAL_TABLET | Freq: Once | ORAL | Status: AC
  Administered 2014-05-09: 2 via ORAL
  Filled 2014-05-09: qty 2

## 2014-05-09 MED ORDER — HYDROCODONE-ACETAMINOPHEN 5-325 MG PO TABS: 1.0000 | ORAL_TABLET | ORAL | Status: DC | PRN

## 2014-05-09 MED ORDER — PENICILLIN V POTASSIUM 250 MG PO TABS
500.0000 mg | ORAL_TABLET | Freq: Once | ORAL | Status: AC
  Administered 2014-05-09: 500 mg via ORAL
  Filled 2014-05-09: qty 2

## 2014-05-09 MED ORDER — PENICILLIN V POTASSIUM 500 MG PO TABS
500.0000 mg | ORAL_TABLET | Freq: Four times a day (QID) | ORAL | Status: DC
Start: 2014-05-09 — End: 2014-09-29

## 2014-05-09 NOTE — ED Provider Notes (Signed)
CSN: [id number]     Arrival date & time 05/09/14  0817 History  This chart was scribed for non-physician practitioner Emilia Beck, working with Lyanne Co, MD by Carl Best, ED Scribe. This patient was seen in room TR09C/TR09C and the patient's care was started at 9:04 AM.   Chief Complaint  Patient presents with  . Dental Pain    HPI Comments: Marc Mata is a 39 y.o. male who presents to the Emergency Department complaining of constant dental pain with associated soreness in his jaw that started three weeks ago.  He came to the ED on 04/18/2014 for the same problem and was prescribed Tramadol and Penicillin for his symptoms and a referral to a dentist.  He finished his Penicillin last week and is now experiencing swelling and pain when yawning and talking.  He states that the Tramadol did not help alleviate his pain.  He has an appointment with the dentist on February 17 but states that he would like something to help alleviate the pain until then.  He denies cervical lymphadenopathy as an associated symptom.  He has a PCP through the [facility] and [facility].   Patient is a 39 y.o. male presenting with tooth pain. The history is provided by the patient. No language interpreter was used.  Dental Pain Associated symptoms: no facial swelling     Past Medical History  Diagnosis Date  . Hypertension Dx 2007   Past Surgical History  Procedure Laterality Date  . Appendectomy  1996   Family History  Problem Relation Age of Onset  . Hypertension Mother   . Heart disease Mother   . Cancer Father   . Diabetes Sister   . Hypertension Brother    History  Substance Use Topics  . Smoking status: Current Every Day Smoker  . Smokeless tobacco: Never Used  . Alcohol Use: 0.0 oz/week    0 Not specified per week     Comment: ocassionally    Review of Systems  HENT: Positive for dental problem. Negative for facial swelling.   Psychiatric/Behavioral: Positive for sleep  disturbance.  All other systems reviewed and are negative.     Allergies  Review of patient's allergies indicates no known allergies.  Home Medications   Prior to Admission medications   Medication Sig Start Date End Date Taking? Authorizing Provider  gabapentin (NEURONTIN) 300 MG capsule Take 1 capsule (300 mg total) by mouth 3 times/day as needed-between meals & bedtime. 03/15/14   Josalyn C Funches, MD  hydrochlorothiazide (HYDRODIURIL) 25 MG tablet Take 1 tablet (25 mg total) by mouth daily. 03/15/14   Josalyn C Funches, MD  lisinopril (PRINIVIL,ZESTRIL) 20 MG tablet Take 1 tablet (20 mg total) by mouth daily. 03/15/14   Lora Paula, MD  meloxicam (MOBIC) 15 MG tablet Take 1 tablet (15 mg total) by mouth daily. 03/15/14   Josalyn C Funches, MD  penicillin v potassium (VEETID) 500 MG tablet Take 1 tablet (500 mg total) by mouth 4 (four) times daily. 04/18/14   Arman Filter, NP  traMADol (ULTRAM) 50 MG tablet Take 1 tablet (50 mg total) by mouth every 6 (six) hours as needed. 04/18/14   Arman Filter, NP  triamcinolone cream (KENALOG) 0.1 % Apply 1 application topically 2 (two) times daily. Apply to R upper back 03/15/14   Lora Paula, MD   Triage Vitals: BP 130/74 mmHg  Pulse 85  Temp(Src) 98 F (36.7 C) (Oral)  Resp 18  SpO2 100%  Physical Exam  Constitutional: He is oriented to person, place, and time. He appears well-developed and well-nourished.  HENT:  Head: Normocephalic and atraumatic.  Mouth/Throat: Oropharynx is clear and moist. No oropharyngeal exudate.  Poor dentition. Right upper 2nd molar tender to percussion and loose. The tooth is cracked and decayed. No abscess noted.   Eyes: EOM are normal.  Neck: Normal range of motion.  Cardiovascular: Normal rate, regular rhythm and normal heart sounds.  Exam reveals no gallop and no friction rub.   No murmur heard. Pulmonary/Chest: Effort normal and breath sounds normal. No respiratory distress. He has no wheezes.  He has no rales.  Abdominal: Soft. He exhibits no distension. There is no tenderness. There is no rebound.  Musculoskeletal: Normal range of motion.  Lymphadenopathy:    He has no cervical adenopathy.  Neurological: He is alert and oriented to person, place, and time.  Skin: Skin is warm and dry.  Psychiatric: He has a normal mood and affect. His behavior is normal.  Nursing note and vitals reviewed.   ED Course  Procedures (including critical care time)  DIAGNOSTIC STUDIES: Oxygen Saturation is 100% on room air, normal by my interpretation.    COORDINATION OF CARE: 9:09 AM- Will refer the patient to the dentist on call to schedule an earlier appointment for the patient.  Will administer pain medication in the ED and discharge the patient with a prescription.    Labs Review Labs Reviewed - No data to display  Imaging Review No results found.   EKG Interpretation None      MDM   Final diagnoses:  Pain, dental    9:15 AM Vitals stable and patient afebrile. Patient will have veetid and vicodin for pain. No signs of ludwigs angina. Patient instructed to return with worsening or concerning symptom.   I personally performed the services described in this documentation, which was scribed in my presence. The recorded information has been reviewed and is accurate.    412 Hamilton CourtKaitlyn IdledaleSzekalski, PA-C 05/09/14 [id number]  Lyanne CoKevin M Campos, MD 05/09/14 [phone]

## 2014-05-09 NOTE — ED Notes (Signed)
Patient states "feels like I have a new dental abscess coming on".  Patient states R upper molar "is hanging on by a thread".   Patient states has an appointment with dentist on 17th of this month.

## 2014-05-09 NOTE — Discharge Instructions (Signed)
Take Veetid as directed until gone. Take Vicodin as needed for pain. Refer to attached documents for more information. Follow up with the recommended dentist. Return to the ED with worsening or concerning symptoms.

## 2014-05-18 ENCOUNTER — Ambulatory Visit: Payer: Self-pay | Attending: Family Medicine | Admitting: Family Medicine

## 2014-05-18 ENCOUNTER — Encounter: Payer: Self-pay | Admitting: Family Medicine

## 2014-05-18 ENCOUNTER — Ambulatory Visit (HOSPITAL_COMMUNITY)
Admission: RE | Admit: 2014-05-18 | Discharge: 2014-05-18 | Disposition: A | Discharge status: Home / Self Care | Payer: Self-pay | Source: Ambulatory Visit | Attending: Family Medicine | Admitting: Family Medicine

## 2014-05-18 ENCOUNTER — Other Ambulatory Visit: Payer: Self-pay

## 2014-05-18 VITALS — BP 135/93 | HR 73 | Temp 99.2°F | Resp 16 | Ht 69.0 in | Wt 167.0 lb

## 2014-05-18 DIAGNOSIS — K029 Dental caries, unspecified: Secondary | ICD-10-CM | POA: Insufficient documentation

## 2014-05-18 DIAGNOSIS — F1290 Cannabis use, unspecified, uncomplicated: Secondary | ICD-10-CM | POA: Insufficient documentation

## 2014-05-18 DIAGNOSIS — R200 Anesthesia of skin: Secondary | ICD-10-CM

## 2014-05-18 DIAGNOSIS — R0789 Other chest pain: Secondary | ICD-10-CM | POA: Insufficient documentation

## 2014-05-18 DIAGNOSIS — K088 Other specified disorders of teeth and supporting structures: Secondary | ICD-10-CM

## 2014-05-18 DIAGNOSIS — R05 Cough: Secondary | ICD-10-CM | POA: Insufficient documentation

## 2014-05-18 DIAGNOSIS — F17200 Nicotine dependence, unspecified, uncomplicated: Secondary | ICD-10-CM | POA: Insufficient documentation

## 2014-05-18 DIAGNOSIS — I10 Essential (primary) hypertension: Secondary | ICD-10-CM | POA: Insufficient documentation

## 2014-05-18 DIAGNOSIS — R079 Chest pain, unspecified: Secondary | ICD-10-CM | POA: Insufficient documentation

## 2014-05-18 DIAGNOSIS — K0889 Other specified disorders of teeth and supporting structures: Secondary | ICD-10-CM | POA: Insufficient documentation

## 2014-05-18 DIAGNOSIS — M545 Low back pain: Secondary | ICD-10-CM | POA: Insufficient documentation

## 2014-05-18 MED ORDER — LISINOPRIL 40 MG PO TABS: 40.0000 mg | ORAL_TABLET | Freq: Every day | ORAL | Status: DC

## 2014-05-18 MED ORDER — ASPIRIN EC 81 MG PO TBEC: 81.0000 mg | DELAYED_RELEASE_TABLET | Freq: Every day | ORAL | Status: DC

## 2014-05-18 NOTE — Assessment & Plan Note (Addendum)
A: atypical chest pain male, HTN, smoker, family history of cardiac disease in mother at young age. Normal EKG today.  Normal exam. CP at rest.  P: 81 mg aspirin BP control  D-dimer  CXR Close follow up

## 2014-05-18 NOTE — Progress Notes (Signed)
Patient has right upper teeth pain for last year Patient seen in ED 3 x and given antibiotics and pain medication Patient still taking abx but out of pain medication

## 2014-05-18 NOTE — Patient Instructions (Addendum)
Mr Marc Mata,  1. Tooth pain with broken off and partially hanging molars on R upper: Continue pain medicine and penicillin Urgent dental referral placed.    2. Chest pain:  Normal EKG and exam today Plan: Avoid smoking as much as possible. Avoid all drugs including marijuana Start aspirin 81 mg daily, stop 3 days prior to dental work, then restart the day after D-dimer to rule out blood clot in lungs Chest x-ray.  3. HTN: goal <140/90.  Increase lisinopril to 40 mg daily  Continue HCTZ 25 mg daily   F/u in 4 weeks, sooner if needed  Dr. Armen PickupFunches

## 2014-05-19 ENCOUNTER — Telehealth: Payer: Self-pay | Admitting: *Deleted

## 2014-05-19 LAB — D-DIMER, QUANTITATIVE: D-Dimer, Quant: 0.33 ug/mL-FEU (ref 0.00–0.48)

## 2014-05-19 NOTE — Telephone Encounter (Signed)
-----   Message from Lora PaulaJosalyn C Funches, MD sent at 05/19/2014  8:50 AM EST ----- Normal d-dimer no evidence of blood clot. Particularly no evidence of PE which is lung clot.

## 2014-05-19 NOTE — Telephone Encounter (Signed)
-----   Message from Lora PaulaJosalyn C Funches, MD sent at 05/19/2014  8:49 AM EST ----- Normal chest x-ray obtained for chest pain.

## 2014-05-19 NOTE — Progress Notes (Signed)
   Subjective:    Patient ID: Marc Mata, male    DOB: 02-Oct-1975, 39 y.o.   MRN: [id number] CC: dental referral request, chest pains  HPI 39 yo M:  1. Dental referral: patient taking penicillin and vicodin for carries and dental pain R upper molar. No fever or swelling.   2. Chest pain: sharp  L sided pains. Come and go. No SOB, diaphoresis, syncope, heart burn. Taking BP medicine. Smoking, THC. No compliant with low salt diet.   Soc Hx: current smoker, THC Review of Systems As per HPI     Objective:   Physical Exam BP 135/93 mmHg  Pulse 73  Temp(Src) 99.2 F (37.3 C)  Resp 16  Ht 5\' 9"  (1.753 m)  Wt 167 lb (75.751 kg)  BMI 24.65 kg/m2  SpO2 100% General appearance: alert, cooperative and no distress Throat: normal findings: lips normal without lesions and buccal mucosa normal and abnormal findings: dentition: multiple carries, upper R 3rd molar broken off, 2nd molar hanging. 1st molar carrie Lungs: clear to auscultation bilaterally Heart: regular rate and rhythm, S1, S2 normal, no murmur, click, rub or gallop  EKG: normal EKG, normal sinus rhythm, unchanged from previous tracings.      Assessment & Plan:
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
## 2014-05-19 NOTE — Telephone Encounter (Signed)
Pt aware of results 

## 2014-05-19 NOTE — Telephone Encounter (Signed)
Pt aware of lab results 

## 2014-05-20 NOTE — Assessment & Plan Note (Signed)
HTN: goal <140/90.  Increase lisinopril to 40 mg daily  Continue HCTZ 25 mg daily

## 2014-05-20 NOTE — Assessment & Plan Note (Signed)
Tooth pain with broken off and partially hanging molars on R upper: Continue pain medicine and penicillin Urgent dental referral placed.

## 2014-07-17 ENCOUNTER — Telehealth: Payer: Self-pay | Admitting: Family Medicine

## 2014-07-17 DIAGNOSIS — I10 Essential (primary) hypertension: Secondary | ICD-10-CM

## 2014-07-17 MED ORDER — HYDROCHLOROTHIAZIDE 25 MG PO TABS: 25.0000 mg | ORAL_TABLET | Freq: Every day | ORAL | Status: DC

## 2014-07-17 NOTE — Telephone Encounter (Signed)
Patient called requesting medication refill on hydrochlorothiazide (HYDRODIURIL) 25 MG tablet . Please f/u with pt.[facility]

## 2014-07-17 NOTE — Telephone Encounter (Signed)
Sent in HCTZ

## 2014-07-17 NOTE — Addendum Note (Signed)
Addended by: Dessa PhiFUNCHES, Wesly Whisenant on: 07/17/2014 02:42 PM   Modules accepted: Orders

## 2014-07-25 NOTE — Telephone Encounter (Signed)
Error

## 2014-08-18 ENCOUNTER — Encounter (HOSPITAL_COMMUNITY): Payer: Self-pay | Admitting: *Deleted

## 2014-08-18 ENCOUNTER — Emergency Department (HOSPITAL_COMMUNITY)
Admission: EM | Admit: 2014-08-18 | Discharge: 2014-08-18 | Disposition: A | Discharge status: Home / Self Care | Payer: Self-pay | Attending: Emergency Medicine | Admitting: Emergency Medicine

## 2014-08-18 DIAGNOSIS — Z7982 Long term (current) use of aspirin: Secondary | ICD-10-CM | POA: Insufficient documentation

## 2014-08-18 DIAGNOSIS — Y9389 Activity, other specified: Secondary | ICD-10-CM | POA: Insufficient documentation

## 2014-08-18 DIAGNOSIS — W57XXXA Bitten or stung by nonvenomous insect and other nonvenomous arthropods, initial encounter: Secondary | ICD-10-CM | POA: Insufficient documentation

## 2014-08-18 DIAGNOSIS — Y9289 Other specified places as the place of occurrence of the external cause: Secondary | ICD-10-CM | POA: Insufficient documentation

## 2014-08-18 DIAGNOSIS — I10 Essential (primary) hypertension: Secondary | ICD-10-CM | POA: Insufficient documentation

## 2014-08-18 DIAGNOSIS — S30862A Insect bite (nonvenomous) of penis, initial encounter: Secondary | ICD-10-CM | POA: Insufficient documentation

## 2014-08-18 DIAGNOSIS — Z792 Long term (current) use of antibiotics: Secondary | ICD-10-CM | POA: Insufficient documentation

## 2014-08-18 DIAGNOSIS — Z791 Long term (current) use of non-steroidal anti-inflammatories (NSAID): Secondary | ICD-10-CM | POA: Insufficient documentation

## 2014-08-18 DIAGNOSIS — Z720 Tobacco use: Secondary | ICD-10-CM | POA: Insufficient documentation

## 2014-08-18 DIAGNOSIS — Y998 Other external cause status: Secondary | ICD-10-CM | POA: Insufficient documentation

## 2014-08-18 NOTE — ED Notes (Signed)
Pt states he was standing at the sink, felt a "slight sting" on penis. Found a small insect crawling on penis. Pt has insect in a box.

## 2014-08-18 NOTE — Discharge Instructions (Signed)
Return to the emergency room with worsening of symptoms, new symptoms or with symptoms that are concerning, especially fevers, nausea vomiting, rash, numbness tingling, joint pain, swelling, fatigue unwell feeling, redness, swelling. Ibuprofen 400mg  (2 tablets 200mg ) every 5-6 hours for 3-5 days. Follow up with PCP if symptoms worsen or are persistent. Read below information and follow recommendations. Tick Bite Information Ticks are insects that attach themselves to the skin and draw blood for food. There are various types of ticks. Common types include wood ticks and deer ticks. Most ticks live in shrubs and grassy areas. Ticks can climb onto your body when you make contact with leaves or grass where the tick is waiting. The most common places on the body for ticks to attach themselves are the scalp, neck, armpits, waist, and groin. Most tick bites are harmless, but sometimes ticks carry germs that cause diseases. These germs can be spread to a person during the tick's feeding process. The chance of a disease spreading through a tick bite depends on:   The type of tick.  Time of year.   How long the tick is attached.   Geographic location.  HOW CAN YOU PREVENT TICK BITES? Take these steps to help prevent tick bites when you are outdoors:  Wear protective clothing. Long sleeves and long pants are best.   Wear white clothes so you can see ticks more easily.  Tuck your pant legs into your socks.   If walking on a trail, stay in the middle of the trail to avoid brushing against bushes.  Avoid walking through areas with long grass.  Put insect repellent on all exposed skin and along boot tops, pant legs, and sleeve cuffs.   Check clothing, hair, and skin repeatedly and before going inside.   Brush off any ticks that are not attached.  Take a shower or bath as soon as possible after being outdoors.  WHAT IS THE PROPER WAY TO REMOVE A TICK? Ticks should be removed as soon as  possible to help prevent diseases caused by tick bites. 1. If latex gloves are available, put them on before trying to remove a tick.  2. Using fine-point tweezers, grasp the tick as close to the skin as possible. You may also use curved forceps or a tick removal tool. Grasp the tick as close to its head as possible. Avoid grasping the tick on its body. 3. Pull gently with steady upward pressure until the tick lets go. Do not twist the tick or jerk it suddenly. This may break off the tick's head or mouth parts. 4. Do not squeeze or crush the tick's body. This could force disease-carrying fluids from the tick into your body.  5. After the tick is removed, wash the bite area and your hands with soap and water or other disinfectant such as alcohol. 6. Apply a small amount of antiseptic cream or ointment to the bite site.  7. Wash and disinfect any instruments that were used.  Do not try to remove a tick by applying a hot match, petroleum jelly, or fingernail polish to the tick. These methods do not work and may increase the chances of disease being spread from the tick bite.  WHEN SHOULD YOU SEEK MEDICAL CARE? Contact your health care provider if you are unable to remove a tick from your skin or if a part of the tick breaks off and is stuck in the skin.  After a tick bite, you need to be aware of signs and  symptoms that could be related to diseases spread by ticks. Contact your health care provider if you develop any of the following in the days or weeks after the tick bite:  Unexplained fever.  Rash. A circular rash that appears days or weeks after the tick bite may indicate the possibility of Lyme disease. The rash may resemble a target with a bull's-eye and may occur at a different part of your body than the tick bite.  Redness and swelling in the area of the tick bite.   Tender, swollen lymph glands.   Diarrhea.   Weight loss.   Cough.   Fatigue.   Muscle, joint, or bone pain.    Abdominal pain.   Headache.   Lethargy or a change in your level of consciousness.  Difficulty walking or moving your legs.   Numbness in the legs.   Paralysis.  Shortness of breath.   Confusion.   Repeated vomiting.  Document Released: 03/21/2000 Document Revised: 01/12/2013 Document Reviewed: 09/01/2012 [facility] Patient Information 2015 [place]. This information is not intended to replace advice given to you by your health care provider. Make sure you discuss any questions you have with your health care provider.

## 2014-08-18 NOTE — ED Notes (Signed)
Pt left before receiving discharge instructions.

## 2014-08-18 NOTE — ED Notes (Signed)
Pt reports insect bite to groin area. No other complaints. Has insect with him.

## 2014-08-18 NOTE — ED Provider Notes (Signed)
CSN: [id number]     Arrival date & time 08/18/14  1210 History  This chart was scribed for non-physician practitioner, Oswaldo ConroyVictoria Mariya Mottley, working with Gilda Creasehristopher J Pollina, MD by Richarda Overlieichard Holland, ED Scribe. This patient was seen in room TR09C/TR09C and the patient's care was started at 12:27 PM.   Chief Complaint  Patient presents with  . Insect Bite   HPI HPI Comments: Marc Fairlycey Coderre is a 39 y.o. male with a history of HTN who presents to the Emergency Department for an insect that he found on his penis PTA. Pt states he was standing at the sink and felt a "slight sting" on his penis. Pt states he found a small insect crawling on his penis that he has in a box at the ED. He reports no other complaints at this time. Pt states he does not think that the insect bit him. Pt with 616 month old puppy and thinks the insect from them. He denies any rash. Pt reports no modifying factors at this time. He denies any marks on his penis, penile swelling, penile discharge, testicular pain, fevers, chills, nausea or vomiting or joint pain.   Past Medical History  Diagnosis Date  . Hypertension Dx 2007   Past Surgical History  Procedure Laterality Date  . Appendectomy  1996   Family History  Problem Relation Age of Onset  . Hypertension Mother   . Heart disease Mother   . Cancer Father   . Diabetes Sister   . Hypertension Brother    History  Substance Use Topics  . Smoking status: Current Every Day Smoker  . Smokeless tobacco: Never Used  . Alcohol Use: 0.0 oz/week    0 Standard drinks or equivalent per week     Comment: ocassionally    Review of Systems  Constitutional: Negative for fever and chills.  Gastrointestinal: Negative for nausea and vomiting.  Genitourinary: Negative for discharge, penile swelling, scrotal swelling, penile pain and testicular pain.  Musculoskeletal: Negative for joint swelling and arthralgias.   Allergies  Review of patient's allergies indicates no known  allergies.  Home Medications   Prior to Admission medications   Medication Sig Start Date End Date Taking? Authorizing Provider  aspirin EC 81 MG tablet Take 1 tablet (81 mg total) by mouth daily. 05/18/14   Josalyn Funches, MD  gabapentin (NEURONTIN) 300 MG capsule Take 1 capsule (300 mg total) by mouth 3 times/day as needed-between meals & bedtime. 03/15/14   Josalyn Funches, MD  hydrochlorothiazide (HYDRODIURIL) 25 MG tablet Take 1 tablet (25 mg total) by mouth daily. 07/17/14   Dessa PhiJosalyn Funches, MD  HYDROcodone-acetaminophen (NORCO/VICODIN) 5-325 MG per tablet Take 1-2 tablets by mouth every 4 (four) hours as needed for moderate pain or severe pain. Patient not taking: Reported on 05/18/2014 05/09/14   Emilia BeckKaitlyn Szekalski, PA-C  lisinopril (PRINIVIL,ZESTRIL) 40 MG tablet Take 1 tablet (40 mg total) by mouth daily. 05/18/14   Josalyn Funches, MD  meloxicam (MOBIC) 15 MG tablet Take 1 tablet (15 mg total) by mouth daily. 03/15/14   Josalyn Funches, MD  penicillin v potassium (VEETID) 500 MG tablet Take 1 tablet (500 mg total) by mouth 4 (four) times daily. 05/09/14   Kaitlyn Szekalski, PA-C  traMADol (ULTRAM) 50 MG tablet Take 1 tablet (50 mg total) by mouth every 6 (six) hours as needed. 04/18/14   Earley FavorGail Schulz, NP  triamcinolone cream (KENALOG) 0.1 % Apply 1 application topically 2 (two) times daily. Apply to R upper back 03/15/14   Josalyn Funches,  MD   BP 172/93 mmHg  Pulse 80  Temp(Src) 99 F (37.2 C) (Oral)  Resp 18  SpO2 100% Physical Exam  Constitutional: He is oriented to person, place, and time. He appears well-developed and well-nourished. No distress.  HENT:  Head: Normocephalic and atraumatic.  Eyes: Conjunctivae are normal. Pupils are equal, round, and reactive to light. Right eye exhibits no discharge. Left eye exhibits no discharge.  Neck: No tracheal deviation present.  Pulmonary/Chest: Effort normal. No respiratory distress.  Abdominal: He exhibits no distension.  Neurological: He  is alert and oriented to person, place, and time. Coordination normal.  Pt moves all extremities spontaneously. No facial droop, pronator drift. Normal gait.   Skin: He is not diaphoretic.  Psychiatric: He has a normal mood and affect. His behavior is normal.  Nursing note and vitals reviewed.   ED Course  Procedures   DIAGNOSTIC STUDIES: Oxygen Saturation is 100% on RA, normal by my interpretation.    COORDINATION OF CARE: 12:33 PM Discussed treatment plan with pt at bedside and pt agreed to plan.   Labs Review Labs Reviewed - No data to display  Imaging Review No results found.   EKG Interpretation None      MDM   Final diagnoses:  Insect bite   Pt presents after possible insect bite. Pt has insect with him which is a tick that is not engorged. VSS. Neurological exam without deficits. No rash or systemic symptoms. Pt adamantly refusing genital exam. Pt could not give a reason. Pt without symptoms well appearing with stable vitals. nonfocal neurological exam. It is reasonable to defer exam for PCP. Encouraged patient to return with any concerning symptoms.   Discussed return precautions with patient. Discussed all results and patient verbalizes understanding and agrees with plan.  I personally performed the services described in this documentation, which was scribed in my presence. The recorded information has been reviewed and is accurate.   Oswaldo ConroyVictoria Lasaundra Riche, PA-C 08/18/14 1441  Gilda Creasehristopher J Pollina, MD 08/19/14 [phone]

## 2014-09-07 ENCOUNTER — Other Ambulatory Visit: Payer: Self-pay | Admitting: Family Medicine

## 2014-09-07 DIAGNOSIS — K0889 Other specified disorders of teeth and supporting structures: Secondary | ICD-10-CM

## 2014-09-07 DIAGNOSIS — I10 Essential (primary) hypertension: Secondary | ICD-10-CM

## 2014-09-07 NOTE — Telephone Encounter (Signed)
Pt called requesting medication refill for hydrochlorothiazide (HYDRODIURIL) 25 MG tablet [[id number]][[id number]]  And pain medication due to his tooth pain Please f/u with patient . [facility]

## 2014-09-11 MED ORDER — ACETAMINOPHEN-CODEINE #3 300-30 MG PO TABS: 1.0000 | ORAL_TABLET | Freq: Two times a day (BID) | ORAL | Status: DC

## 2014-09-11 NOTE — Telephone Encounter (Signed)
Called patient. HCTZ has refills, he needs to check with pharmacy. Tylenol #3 ordered for dental pain, patient refused tramadol.  Tylenol #3 up front for pick up.

## 2014-09-15 ENCOUNTER — Ambulatory Visit: Payer: Self-pay | Admitting: Family Medicine

## 2014-09-20 ENCOUNTER — Emergency Department (HOSPITAL_COMMUNITY)
Admission: EM | Admit: 2014-09-20 | Discharge: 2014-09-20 | Discharge status: Against Medical Advice | Payer: Medicaid Other | Attending: Emergency Medicine | Admitting: Emergency Medicine

## 2014-09-20 ENCOUNTER — Encounter (HOSPITAL_COMMUNITY): Payer: Self-pay | Admitting: Emergency Medicine

## 2014-09-20 DIAGNOSIS — Z720 Tobacco use: Secondary | ICD-10-CM | POA: Insufficient documentation

## 2014-09-20 DIAGNOSIS — K0381 Cracked tooth: Secondary | ICD-10-CM | POA: Insufficient documentation

## 2014-09-20 DIAGNOSIS — K088 Other specified disorders of teeth and supporting structures: Secondary | ICD-10-CM | POA: Diagnosis present

## 2014-09-20 DIAGNOSIS — I10 Essential (primary) hypertension: Secondary | ICD-10-CM | POA: Diagnosis not present

## 2014-09-20 DIAGNOSIS — K0889 Other specified disorders of teeth and supporting structures: Secondary | ICD-10-CM

## 2014-09-20 NOTE — ED Provider Notes (Signed)
Blood pressure 136/83, pulse 87, temperature 98.4 F (36.9 C), temperature source Oral, resp. rate 18, SpO2 99 %.  Marc Mata is a 39 y.o. male complaining of until pain. Patient left without being seen after triage. Did not participate the care of this patient.  Wynetta Emery, PA-C 09/20/14 1300  Pricilla Loveless, MD 09/21/14 [phone]

## 2014-09-20 NOTE — ED Notes (Signed)
Pt walked by RN station towards the exit, RN stopped patient to ask if he was leaving and he said yes he could not stay any longer. RN made him aware of risks and benefits of leaving AMA. Stated he still could not stay.

## 2014-09-20 NOTE — ED Notes (Signed)
Pt here for right upper dental pain x 2 weeks from broken tooth

## 2014-09-29 ENCOUNTER — Encounter (HOSPITAL_COMMUNITY): Payer: Self-pay | Admitting: Emergency Medicine

## 2014-09-29 ENCOUNTER — Emergency Department (INDEPENDENT_AMBULATORY_CARE_PROVIDER_SITE_OTHER)
Admission: EM | Admit: 2014-09-29 | Discharge: 2014-09-29 | Disposition: A | Discharge status: Home / Self Care | Payer: No Typology Code available for payment source | Source: Home / Self Care | Attending: Family Medicine | Admitting: Family Medicine

## 2014-09-29 DIAGNOSIS — G8929 Other chronic pain: Secondary | ICD-10-CM

## 2014-09-29 DIAGNOSIS — K088 Other specified disorders of teeth and supporting structures: Secondary | ICD-10-CM

## 2014-09-29 DIAGNOSIS — K0889 Other specified disorders of teeth and supporting structures: Secondary | ICD-10-CM

## 2014-09-29 DIAGNOSIS — K089 Disorder of teeth and supporting structures, unspecified: Secondary | ICD-10-CM

## 2014-09-29 MED ORDER — HYDROCODONE-ACETAMINOPHEN 5-325 MG PO TABS: 1.0000 | ORAL_TABLET | ORAL | Status: DC | PRN

## 2014-09-29 MED ORDER — PENICILLIN V POTASSIUM 500 MG PO TABS: 500.0000 mg | ORAL_TABLET | Freq: Four times a day (QID) | ORAL | Status: DC

## 2014-09-29 NOTE — Discharge Instructions (Signed)
Dental Pain Must follow with your dentist.  The Urgent care will not be able to continue to prescribe controlled pain killers for chronic tooth pain. A tooth ache may be caused by cavities (tooth decay). Cavities expose the nerve of the tooth to air and hot or cold temperatures. It may come from an infection or abscess (also called a boil or furuncle) around your tooth. It is also often caused by dental caries (tooth decay). This causes the pain you are having. DIAGNOSIS  Your caregiver can diagnose this problem by exam. TREATMENT   If caused by an infection, it may be treated with medications which kill germs (antibiotics) and pain medications as prescribed by your caregiver. Take medications as directed.  Only take over-the-counter or prescription medicines for pain, discomfort, or fever as directed by your caregiver.  Whether the tooth ache today is caused by infection or dental disease, you should see your dentist as soon as possible for further care. SEEK MEDICAL CARE IF: The exam and treatment you received today has been provided on an emergency basis only. This is not a substitute for complete medical or dental care. If your problem worsens or new problems (symptoms) appear, and you are unable to meet with your dentist, call or return to this location. SEEK IMMEDIATE MEDICAL CARE IF:   You have a fever.  You develop redness and swelling of your face, jaw, or neck.  You are unable to open your mouth.  You have severe pain uncontrolled by pain medicine. MAKE SURE YOU:  Dental Care and Dentist Visits Dental care supports good overall health. Regular dental visits can also help you avoid dental pain, bleeding, infection, and other more serious health problems in the future. It is important to keep the mouth healthy because diseases in the teeth, gums, and other oral tissues can spread to other areas of the body. Some problems, such as diabetes, heart disease, and pre-term labor have been  associated with poor oral health.  See your dentist every 6 months. If you experience emergency problems such as a toothache or broken tooth, go to the dentist right away. If you see your dentist regularly, you may catch problems early. It is easier to be treated for problems in the early stages.  WHAT TO EXPECT AT A DENTIST VISIT  Your dentist will look for many common oral health problems and recommend proper treatment. At your regular dental visit, you can expect:  Gentle cleaning of the teeth and gums. This includes scraping and polishing. This helps to remove the sticky substance around the teeth and gums (plaque). Plaque forms in the mouth shortly after eating. Over time, plaque hardens on the teeth as tartar. If tartar is not removed regularly, it can cause problems. Cleaning also helps remove stains.  Periodic X-rays. These pictures of the teeth and supporting bone will help your dentist assess the health of your teeth.  Periodic fluoride treatments. Fluoride is a natural mineral shown to help strengthen teeth. Fluoride treatmentinvolves applying a fluoride gel or varnish to the teeth. It is most commonly done in children.  Examination of the mouth, tongue, jaws, teeth, and gums to look for any oral health problems, such as:  Cavities (dental caries). This is decay on the tooth caused by plaque, sugar, and acid in the mouth. It is best to catch a cavity when it is small.  Inflammation of the gums caused by plaque buildup (gingivitis).  Problems with the mouth or malformed or misaligned teeth.  Oral cancer or other diseases of the soft tissues or jaws. KEEP YOUR TEETH AND GUMS HEALTHY For healthy teeth and gums, follow these general guidelines as well as your dentist's specific advice:  Have your teeth professionally cleaned at the dentist every 6 months.  Brush twice daily with a fluoride toothpaste.  Floss your teeth daily.  Ask your dentist if you need fluoride supplements,  treatments, or fluoride toothpaste.  Eat a healthy diet. Reduce foods and drinks with added sugar.  Avoid smoking. TREATMENT FOR ORAL HEALTH PROBLEMS If you have oral health problems, treatment varies depending on the conditions present in your teeth and gums.  Your caregiver will most likely recommend good oral hygiene at each visit.  For cavities, gingivitis, or other oral health disease, your caregiver will perform a procedure to treat the problem. This is typically done at a separate appointment. Sometimes your caregiver will refer you to another dental specialist for specific tooth problems or for surgery. SEEK IMMEDIATE DENTAL CARE IF:  You have pain, bleeding, or soreness in the gum, tooth, jaw, or mouth area.  A permanent tooth becomes loose or separated from the gum socket.  You experience a blow or injury to the mouth or jaw area. Document Released: 12/04/2010 Document Revised: 06/16/2011 Document Reviewed: 12/04/2010 [facility] Patient Information 2015 [place], [place]. This information is not intended to replace advice given to you by your health care provider. Make sure you discuss any questions you have with your health care provider.   Understand these instructions.  Will watch your condition.  Will get help right away if you are not doing well or get worse. Document Released: 03/24/2005 Document Revised: 06/16/2011 Document Reviewed: 11/10/2007 [facility] Patient Information 2015 [place], [place]. This information is not intended to replace advice given to you by your health care provider. Make sure you discuss any questions you have with your health care provider.

## 2014-09-29 NOTE — ED Provider Notes (Signed)
CSN: [id number]     Arrival date & time 09/29/14  1303 History   First MD Initiated Contact with Patient 09/29/14 1339     Chief Complaint  Patient presents with  . Dental Pain  . Wrist Pain   (Consider location/radiation/quality/duration/timing/severity/associated sxs/prior Treatment) HPI Comments: 39 year old male with a history of chronic tooth problems for which she has visited the ER 4 times this year and his PCP at least twice. ER made an urgent referral to a dentist however he missed that appointment because he did not have $30 to go along with his [employer] card to pay for it. He points to the upper right third molar as a source of pain.   Past Medical History  Diagnosis Date  . Hypertension Dx 2007   Past Surgical History  Procedure Laterality Date  . Appendectomy  1996   Family History  Problem Relation Age of Onset  . Hypertension Mother   . Heart disease Mother   . Cancer Father   . Diabetes Sister   . Hypertension Brother    History  Substance Use Topics  . Smoking status: Current Every Day Smoker  . Smokeless tobacco: Never Used  . Alcohol Use: 0.0 oz/week    0 Standard drinks or equivalent per week     Comment: ocassionally    Review of Systems  Constitutional: Negative.  Negative for fever.  HENT: Positive for dental problem. Negative for facial swelling.   Respiratory: Negative.   Skin: Negative.   Neurological: Negative.     Allergies  Review of patient's allergies indicates no known allergies.  Home Medications   Prior to Admission medications   Medication Sig Start Date End Date Taking? Authorizing Provider  acetaminophen-codeine (TYLENOL #3) 300-30 MG per tablet Take 1 tablet by mouth 2 (two) times daily. 09/11/14   Dessa Phi, MD  aspirin EC 81 MG tablet Take 1 tablet (81 mg total) by mouth daily. 05/18/14   Josalyn Funches, MD  gabapentin (NEURONTIN) 300 MG capsule Take 1 capsule (300 mg total) by mouth 3 times/day as needed-between meals &  bedtime. 03/15/14   Josalyn Funches, MD  hydrochlorothiazide (HYDRODIURIL) 25 MG tablet Take 1 tablet (25 mg total) by mouth daily. 07/17/14   Dessa Phi, MD  HYDROcodone-acetaminophen (NORCO/VICODIN) 5-325 MG per tablet Take 1 tablet by mouth every 4 (four) hours as needed. 09/29/14   Hayden Rasmussen, NP  lisinopril (PRINIVIL,ZESTRIL) 40 MG tablet Take 1 tablet (40 mg total) by mouth daily. 05/18/14   Josalyn Funches, MD  meloxicam (MOBIC) 15 MG tablet Take 1 tablet (15 mg total) by mouth daily. 03/15/14   Josalyn Funches, MD  penicillin v potassium (VEETID) 500 MG tablet Take 1 tablet (500 mg total) by mouth 4 (four) times daily. 09/29/14   Hayden Rasmussen, NP  triamcinolone cream (KENALOG) 0.1 % Apply 1 application topically 2 (two) times daily. Apply to R upper back 03/15/14   Josalyn Funches, MD   BP 143/91 mmHg  Pulse 60  Temp(Src) 97.3 F (36.3 C) (Oral)  Resp 14  SpO2 100% Physical Exam  Constitutional: He appears well-developed and well-nourished. No distress.  HENT:  Mouth/Throat: No oropharyngeal exudate.  Right upper third molar is very loose. It is mobile with movement by the tongue blade. There is mild swelling to the buccal mucosa adjacent to the affected tooth. No actual abscess is seen.  Neck: Normal range of motion. Neck supple.  Lymphadenopathy:    He has no cervical adenopathy.  Neurological: He is  alert. He exhibits normal muscle tone.  Skin: Skin is warm and dry.  Psychiatric: He has a normal mood and affect.  Nursing note and vitals reviewed.   ED Course  Procedures (including critical care time) Labs Review Labs Reviewed - No data to display  Imaging Review No results found.   MDM   1. Chronic dental pain   2. Tooth loose    Must follow with your dentist.  The Urgent care will not be able to continue to prescribe controlled pain killers for chronic tooth pain. norco 5 mg #10 Pen V K 500 mg qid Hx documentation of cocaine and THC use and smoking. Omitting  these may allow savings of $30.00 to see dentist.   Hayden Rasmussen, NP 09/29/14 1402

## 2014-09-29 NOTE — ED Notes (Signed)
Pt comes in today with c/o right side mouth pain from loose tooth and swelling States, he missed dental appointment last week Tramadol was given at PCP office without relief C/o right wrist pain x 2 weeks, no swelling or diff moving

## 2015-02-08 ENCOUNTER — Encounter (HOSPITAL_COMMUNITY): Payer: Self-pay | Admitting: *Deleted

## 2015-02-08 ENCOUNTER — Emergency Department (HOSPITAL_COMMUNITY): Payer: Medicaid Other

## 2015-02-08 ENCOUNTER — Emergency Department (HOSPITAL_COMMUNITY)
Admission: EM | Admit: 2015-02-08 | Discharge: 2015-02-08 | Disposition: A | Discharge status: Home / Self Care | Payer: Medicaid Other | Attending: Emergency Medicine | Admitting: Emergency Medicine

## 2015-02-08 DIAGNOSIS — J069 Acute upper respiratory infection, unspecified: Secondary | ICD-10-CM | POA: Insufficient documentation

## 2015-02-08 DIAGNOSIS — I10 Essential (primary) hypertension: Secondary | ICD-10-CM | POA: Insufficient documentation

## 2015-02-08 DIAGNOSIS — Z79899 Other long term (current) drug therapy: Secondary | ICD-10-CM | POA: Insufficient documentation

## 2015-02-08 DIAGNOSIS — Z7982 Long term (current) use of aspirin: Secondary | ICD-10-CM | POA: Insufficient documentation

## 2015-02-08 DIAGNOSIS — Z720 Tobacco use: Secondary | ICD-10-CM

## 2015-02-08 MED ORDER — GUAIFENESIN-CODEINE 100-10 MG/5ML PO SOLN: 5.0000 mL | Freq: Four times a day (QID) | ORAL | Status: DC | PRN

## 2015-02-08 NOTE — Discharge Instructions (Signed)
Upper Respiratory Infection, Adult Most upper respiratory infections (URIs) are caused by a virus. A URI affects the nose, throat, and upper air passages. The most common type of URI is often called "the common cold." HOME CARE   Take medicines only as told by your doctor.  Gargle warm saltwater or take cough drops to comfort your throat as told by your doctor.  Use a warm mist humidifier or inhale steam from a shower to increase air moisture. This may make it easier to breathe.  Drink enough fluid to keep your pee (urine) clear or pale yellow.  Eat soups and other clear broths.  Have a healthy diet.  Rest as needed.  Go back to work when your fever is gone or your doctor says it is okay.  You may need to stay home longer to avoid giving your URI to others.  You can also wear a face mask and wash your hands often to prevent spread of the virus.  Use your inhaler more if you have asthma.  Do not use any tobacco products, including cigarettes, chewing tobacco, or electronic cigarettes. If you need help quitting, ask your doctor. GET HELP IF:  You are getting worse, not better.  Your symptoms are not helped by medicine.  You have chills.  You are getting more short of breath.  You have brown or red mucus.  You have yellow or brown discharge from your nose.  You have pain in your face, especially when you bend forward.  You have a fever.  You have puffy (swollen) neck glands.  You have pain while swallowing.  You have white areas in the back of your throat. GET HELP RIGHT AWAY IF:   You have very bad or constant:  Headache.  Ear pain.  Pain in your forehead, behind your eyes, and over your cheekbones (sinus pain).  Chest pain.  You have long-lasting (chronic) lung disease and any of the following:  Wheezing.  Long-lasting cough.  Coughing up blood.  A change in your usual mucus.  You have a stiff neck.  You have changes in  your:  Vision.  Hearing.  Thinking.  Mood. MAKE SURE YOU:   Understand these instructions.  Will watch your condition.  Will get help right away if you are not doing well or get worse.   This information is not intended to replace advice given to you by your health care provider. Make sure you discuss any questions you have with your health care provider.   Document Released: 09/10/2007 Document Revised: 08/08/2014 Document Reviewed: 06/29/2013 Elsevier Interactive Patient Education 2016 [employer]  Steps to Quit Smoking  Smoking tobacco can be harmful to your health and can affect almost every organ in your body. Smoking puts you, and those around you, at risk for developing many serious chronic diseases. Quitting smoking is difficult, but it is one of the best things that you can do for your health. It is never too late to quit. WHAT ARE THE BENEFITS OF QUITTING SMOKING? When you quit smoking, you lower your risk of developing serious diseases and conditions, such as:  Lung cancer or lung disease, such as COPD.  Heart disease.  Stroke.  Heart attack.  Infertility.  Osteoporosis and bone fractures. Additionally, symptoms such as coughing, wheezing, and shortness of breath may get better when you quit. You may also find that you get sick less often because your body is stronger at fighting off colds and infections. If you are pregnant,  quitting smoking can help to reduce your chances of having a baby of low birth weight. HOW DO I GET READY TO QUIT? When you decide to quit smoking, create a plan to make sure that you are successful. Before you quit:  Pick a date to quit. Set a date within the next two weeks to give you time to prepare.  Write down the reasons why you are quitting. Keep this list in places where you will see it often, such as on your bathroom mirror or in your car or wallet.  Identify the people, places, things, and activities that make you want to  smoke (triggers) and avoid them. Make sure to take these actions:  Throw away all cigarettes at home, at work, and in your car.  Throw away smoking accessories, such as Set designer.  Clean your car and make sure to empty the ashtray.  Clean your home, including curtains and carpets.  Tell your family, friends, and coworkers that you are quitting. Support from your loved ones can make quitting easier.  Talk with your health care provider about your options for quitting smoking.  Find out what treatment options are covered by your health insurance. WHAT STRATEGIES CAN I USE TO QUIT SMOKING?  Talk with your healthcare provider about different strategies to quit smoking. Some strategies include:  Quitting smoking altogether instead of gradually lessening how much you smoke over a period of time. Research shows that quitting "cold [place]" is more successful than gradually quitting.  Attending in-person counseling to help you build problem-solving skills. You are more likely to have success in quitting if you attend several counseling sessions. Even short sessions of 10 minutes can be effective.  Finding resources and support systems that can help you to quit smoking and remain smoke-free after you quit. These resources are most helpful when you use them often. They can include:  Online chats with a Veterinary surgeon.  Telephone quitlines.  Printed Materials engineer.  Support groups or group counseling.  Text messaging programs.  Mobile phone applications.  Taking medicines to help you quit smoking. (If you are pregnant or breastfeeding, talk with your health care provider first.) Some medicines contain nicotine and some do not. Both types of medicines help with cravings, but the medicines that include nicotine help to relieve withdrawal symptoms. Your health care provider may recommend:  Nicotine patches, gum, or lozenges.  Nicotine inhalers or sprays.  Non-nicotine medicine  that is taken by mouth. Talk with your health care provider about combining strategies, such as taking medicines while you are also receiving in-person counseling. Using these two strategies together makes you more likely to succeed in quitting than if you used either strategy on its own. If you are pregnant or breastfeeding, talk with your health care provider about finding counseling or other support strategies to quit smoking. Do not take medicine to help you quit smoking unless told to do so by your health care provider. WHAT THINGS CAN I DO TO MAKE IT EASIER TO QUIT? Quitting smoking might feel overwhelming at first, but there is a lot that you can do to make it easier. Take these important actions:  Reach out to your family and friends and ask that they support and encourage you during this time. Call telephone quitlines, reach out to support groups, or work with a counselor for support.  Ask people who smoke to avoid smoking around you.  Avoid places that trigger you to smoke, such as bars, parties, or smoke-break  areas at work.  Spend time around people who do not smoke.  Lessen stress in your life, because stress can be a smoking trigger for some people. To lessen stress, try:  Exercising regularly.  Deep-breathing exercises.  Yoga.  Meditating.  Performing a body scan. This involves closing your eyes, scanning your body from head to toe, and noticing which parts of your body are particularly tense. Purposefully relax the muscles in those areas.  Download or purchase mobile phone or tablet apps (applications) that can help you stick to your quit plan by providing reminders, tips, and encouragement. There are many free apps, such as QuitGuide from the [employer] developer for Disease Control and Prevention). You can find other support for quitting smoking (smoking cessation) through smokefree.gov and other websites. HOW WILL I FEEL WHEN I QUIT SMOKING? Within the first 24 hours of  quitting smoking, you may start to feel some withdrawal symptoms. These symptoms are usually most noticeable 2-3 days after quitting, but they usually do not last beyond 2-3 weeks. Changes or symptoms that you might experience include:  Mood swings.  Restlessness, anxiety, or irritation.  Difficulty concentrating.  Dizziness.  Strong cravings for sugary foods in addition to nicotine.  Mild weight gain.  Constipation.  Nausea.  Coughing or a sore throat.  Changes in how your medicines work in your body.  A depressed mood.  Difficulty sleeping (insomnia). After the first 2-3 weeks of quitting, you may start to notice more positive results, such as:  Improved sense of smell and taste.  Decreased coughing and sore throat.  Slower heart rate.  Lower blood pressure.  Clearer skin.  The ability to breathe more easily.  Fewer sick days. Quitting smoking is very challenging for most people. Do not get discouraged if you are not successful the first time. Some people need to make many attempts to quit before they achieve long-term success. Do your best to stick to your quit plan, and talk with your health care provider if you have any questions or concerns.   This information is not intended to replace advice given to you by your health care provider. Make sure you discuss any questions you have with your health care provider.   Document Released: 03/18/2001 Document Revised: 08/08/2014 Document Reviewed: 08/08/2014 Elsevier Interactive Patient Education 2016 Elsevier Inc.  Managing Your High Blood Pressure Blood pressure is a measurement of how forceful your blood is pressing against the walls of the arteries. Arteries are muscular tubes within the circulatory system. Blood pressure does not stay the same. Blood pressure rises when you are active, excited, or nervous; and it lowers during sleep and relaxation. If the numbers measuring your blood pressure stay above normal most  of the time, you are at risk for health problems. High blood pressure (hypertension) is a long-term (chronic) condition in which blood pressure is elevated. A blood pressure reading is recorded as two numbers, such as 120 over 80 (or 120/80). The first, higher number is called the systolic pressure. It is a measure of the pressure in your arteries as the heart beats. The second, lower number is called the diastolic pressure. It is a measure of the pressure in your arteries as the heart relaxes between beats.  Keeping your blood pressure in a normal range is important to your overall health and prevention of health problems, such as heart disease and stroke. When your blood pressure is uncontrolled, your heart has to work harder than normal. High blood pressure is a  very common condition in adults because blood pressure tends to rise with age. Men and women are equally likely to have hypertension but at different times in life. Before age 22, men are more likely to have hypertension. After 38 years of age, women are more likely to have it. Hypertension is especially common in African Americans. This condition often has no signs or symptoms. The cause of the condition is usually not known. Your caregiver can help you come up with a plan to keep your blood pressure in a normal, healthy range. BLOOD PRESSURE STAGES Blood pressure is classified into four stages: normal, prehypertension, stage 1, and stage 2. Your blood pressure reading will be used to determine what type of treatment, if any, is necessary. Appropriate treatment options are tied to these four stages:  Normal  Systolic pressure (mm Hg): below 120.  Diastolic pressure (mm Hg): below 80. Prehypertension  Systolic pressure (mm Hg): 120 to 139.  Diastolic pressure (mm Hg): 80 to 89. Stage1  Systolic pressure (mm Hg): 140 to 159.  Diastolic pressure (mm Hg): 90 to 99. Stage2  Systolic pressure (mm Hg): 160 or above.  Diastolic pressure  (mm Hg): 100 or above. RISKS RELATED TO HIGH BLOOD PRESSURE Managing your blood pressure is an important responsibility. Uncontrolled high blood pressure can lead to:  A heart attack.  A stroke.  A weakened blood vessel (aneurysm).  Heart failure.  Kidney damage.  Eye damage.  Metabolic syndrome.  Memory and concentration problems. HOW TO MANAGE YOUR BLOOD PRESSURE Blood pressure can be managed effectively with lifestyle changes and medicines (if needed). Your caregiver will help you come up with a plan to bring your blood pressure within a normal range. Your plan should include the following: Education  Read all information provided by your caregivers about how to control blood pressure.  Educate yourself on the latest guidelines and treatment recommendations. New research is always being done to further define the risks and treatments for high blood pressure. Lifestylechanges  Control your weight.  Avoid smoking.  Stay physically active.  Reduce the amount of salt in your diet.  Reduce stress.  Control any chronic conditions, such as high cholesterol or diabetes.  Reduce your alcohol intake. Medicines  Several medicines (antihypertensive medicines) are available, if needed, to bring blood pressure within a normal range. Communication  Review all the medicines you take with your caregiver because there may be side effects or interactions.  Talk with your caregiver about your diet, exercise habits, and other lifestyle factors that may be contributing to high blood pressure.  See your caregiver regularly. Your caregiver can help you create and adjust your plan for managing high blood pressure. RECOMMENDATIONS FOR TREATMENT AND FOLLOW-UP  The following recommendations are based on current guidelines for managing high blood pressure in nonpregnant adults. Use these recommendations to identify the proper follow-up period or treatment option based on your blood  pressure reading. You can discuss these options with your caregiver.  Systolic pressure of 120 to 139 or diastolic pressure of 80 to 89: Follow up with your caregiver as directed.  Systolic pressure of 140 to 160 or diastolic pressure of 90 to 100: Follow up with your caregiver within 2 months.  Systolic pressure above 160 or diastolic pressure above 100: Follow up with your caregiver within 1 month.  Systolic pressure above 180 or diastolic pressure above 110: Consider antihypertensive therapy; follow up with your caregiver within 1 week.  Systolic pressure above 200 or diastolic pressure  above 120: Begin antihypertensive therapy; follow up with your caregiver within 1 week.   This information is not intended to replace advice given to you by your health care provider. Make sure you discuss any questions you have with your health care provider.   Document Released: 12/17/2011 Document Reviewed: 12/17/2011 Elsevier Interactive Patient Education [employer]
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
## 2015-02-08 NOTE — ED Notes (Signed)
Patient verbalized understanding of discharge instructions and states he's no further questions at this time. VS stable.

## 2015-02-08 NOTE — ED Notes (Signed)
Patient reports cough x 2 days with cold like symptoms

## 2015-02-09 ENCOUNTER — Ambulatory Visit: Payer: Medicaid Other | Attending: Family Medicine

## 2015-02-26 ENCOUNTER — Other Ambulatory Visit: Payer: Self-pay | Admitting: Family Medicine

## 2015-02-26 DIAGNOSIS — I10 Essential (primary) hypertension: Secondary | ICD-10-CM

## 2015-02-26 NOTE — Telephone Encounter (Signed)
Nurse called patient, reached voicemail. Left message for patient to call Samarra Ridgely with [facility], at [phone]. Nurse refilled hydrochlorothiazide for 1 month only, Last OV 05/18/2014. Patient needs appointment for additional refills.

## 2015-03-06 ENCOUNTER — Ambulatory Visit: Payer: Self-pay | Admitting: Family Medicine

## 2015-03-14 ENCOUNTER — Encounter (HOSPITAL_COMMUNITY): Payer: Self-pay | Admitting: *Deleted

## 2015-03-14 ENCOUNTER — Emergency Department (INDEPENDENT_AMBULATORY_CARE_PROVIDER_SITE_OTHER)
Admission: EM | Admit: 2015-03-14 | Discharge: 2015-03-14 | Disposition: A | Discharge status: Home / Self Care | Payer: No Typology Code available for payment source | Source: Home / Self Care | Attending: Family Medicine | Admitting: Family Medicine

## 2015-03-14 DIAGNOSIS — L209 Atopic dermatitis, unspecified: Secondary | ICD-10-CM

## 2015-03-14 MED ORDER — TRIAMCINOLONE ACETONIDE 0.1 % EX CREA: 1.0000 "application " | TOPICAL_CREAM | Freq: Two times a day (BID) | CUTANEOUS | Status: DC

## 2015-03-14 NOTE — ED Provider Notes (Signed)
CSN: [id number]     Arrival date & time 03/14/15  1422 History   First MD Initiated Contact with Patient 03/14/15 1526     No chief complaint on file.  (Consider location/radiation/quality/duration/timing/severity/associated sxs/prior Treatment) Patient is a 39 y.o. male presenting with rash. The history is provided by the patient.  Rash Location:  Torso Torso rash location:  R chest Quality: burning, dryness, itchiness and peeling   Severity:  Mild Duration:  6 months Chronicity:  Chronic Context comment:  Has run out of triamcinolone cr that he was prev given and helped with rash. Associated symptoms: no fever, no nausea and not vomiting     Past Medical History  Diagnosis Date  . Hypertension Dx 2007   Past Surgical History  Procedure Laterality Date  . Appendectomy  1996   Family History  Problem Relation Age of Onset  . Hypertension Mother   . Heart disease Mother   . Cancer Father   . Diabetes Sister   . Hypertension Brother    Social History  Substance Use Topics  . Smoking status: Current Every Day Smoker  . Smokeless tobacco: Never Used  . Alcohol Use: 0.0 oz/week    0 Standard drinks or equivalent per week     Comment: ocassionally    Review of Systems  Constitutional: Negative.  Negative for fever.  Gastrointestinal: Negative for nausea and vomiting.  Skin: Positive for rash.  All other systems reviewed and are negative.   Allergies  Review of patient's allergies indicates no known allergies.  Home Medications   Prior to Admission medications   Medication Sig Start Date End Date Taking? Authorizing Provider  acetaminophen-codeine (TYLENOL #3) 300-30 MG per tablet Take 1 tablet by mouth 2 (two) times daily. 09/11/14   Dessa Phi, MD  aspirin EC 81 MG tablet Take 1 tablet (81 mg total) by mouth daily. 05/18/14   Josalyn Funches, MD  gabapentin (NEURONTIN) 300 MG capsule Take 1 capsule (300 mg total) by mouth 3 times/day as needed-between meals &  bedtime. 03/15/14   Josalyn Funches, MD  guaiFENesin-codeine 100-10 MG/5ML syrup Take 5-10 mLs by mouth every 6 (six) hours as needed for cough. 02/08/15   Arthor Captain, PA-C  hydrochlorothiazide (HYDRODIURIL) 25 MG tablet TAKE 1 TABLET BY MOUTH DAILY 02/26/15   Dessa Phi, MD  lisinopril (PRINIVIL,ZESTRIL) 40 MG tablet Take 1 tablet (40 mg total) by mouth daily. 05/18/14   Josalyn Funches, MD  meloxicam (MOBIC) 15 MG tablet Take 1 tablet (15 mg total) by mouth daily. 03/15/14   Josalyn Funches, MD  penicillin v potassium (VEETID) 500 MG tablet Take 1 tablet (500 mg total) by mouth 4 (four) times daily. 09/29/14   Hayden Rasmussen, NP  triamcinolone cream (KENALOG) 0.1 % Apply 1 application topically 2 (two) times daily. 03/14/15   Linna Hoff, MD   Meds Ordered and Administered this Visit  Medications - No data to display  BP 171/104 mmHg  Pulse 69  Temp(Src) 98.7 F (37.1 C) (Oral)  SpO2 97% No data found.   Physical Exam  Constitutional: He is oriented to person, place, and time. He appears well-developed and well-nourished. No distress.  Neurological: He is alert and oriented to person, place, and time.  Skin: Skin is warm and dry. Rash noted.  2 dry peeling noninflammed flat irreg lesions to submammary chest. nontender.  Nursing note and vitals reviewed.   ED Course  Procedures (including critical care time)  Labs Review Labs Reviewed - No data  to display  Imaging Review No results found.   Visual Acuity Review  Right Eye Distance:   Left Eye Distance:   Bilateral Distance:    Right Eye Near:   Left Eye Near:    Bilateral Near:         MDM   1. Dermatitis, atopic        Linna HoffJames D Rilee Wendling, MD 03/14/15 [phone]

## 2015-03-14 NOTE — ED Notes (Signed)
Pt  Has   Rash    X   6  Months  Ran  Out    Out  Of  Cream      3  Months  Ago

## 2015-03-20 ENCOUNTER — Ambulatory Visit: Payer: No Typology Code available for payment source | Attending: Family Medicine | Admitting: Family Medicine

## 2015-03-20 ENCOUNTER — Encounter: Payer: Self-pay | Admitting: Family Medicine

## 2015-03-20 ENCOUNTER — Other Ambulatory Visit: Payer: Self-pay

## 2015-03-20 ENCOUNTER — Ambulatory Visit (HOSPITAL_COMMUNITY)
Admission: RE | Admit: 2015-03-20 | Discharge: 2015-03-20 | Disposition: A | Discharge status: Home / Self Care | Payer: No Typology Code available for payment source | Source: Ambulatory Visit | Attending: Family Medicine | Admitting: Family Medicine

## 2015-03-20 ENCOUNTER — Telehealth: Payer: Self-pay

## 2015-03-20 VITALS — BP 137/87 | HR 67 | Temp 98.7°F | Resp 16 | Ht 69.0 in | Wt 173.0 lb

## 2015-03-20 DIAGNOSIS — Z0000 Encounter for general adult medical examination without abnormal findings: Secondary | ICD-10-CM

## 2015-03-20 DIAGNOSIS — E875 Hyperkalemia: Secondary | ICD-10-CM

## 2015-03-20 DIAGNOSIS — E876 Hypokalemia: Secondary | ICD-10-CM | POA: Insufficient documentation

## 2015-03-20 DIAGNOSIS — K0889 Other specified disorders of teeth and supporting structures: Secondary | ICD-10-CM

## 2015-03-20 DIAGNOSIS — I10 Essential (primary) hypertension: Secondary | ICD-10-CM

## 2015-03-20 LAB — COMPLETE METABOLIC PANEL WITH GFR
ALT: 14 U/L (ref 9–46)
AST: 22 U/L (ref 10–40)
Albumin: 3.6 g/dL (ref 3.6–5.1)
Alkaline Phosphatase: 64 U/L (ref 40–115)
BILIRUBIN TOTAL: 0.4 mg/dL (ref 0.2–1.2)
BUN: 21 mg/dL (ref 7–25)
CO2: 31 mmol/L (ref 20–31)
CREATININE: 1.38 mg/dL — AB (ref 0.60–1.35)
Calcium: 9.9 mg/dL (ref 8.6–10.3)
Chloride: 100 mmol/L (ref 98–110)
GFR, EST AFRICAN AMERICAN: 74 mL/min (ref >=60)
GFR, Est Non African American: 64 mL/min (ref >=60)
GLUCOSE: 77 mg/dL (ref 65–99)
Potassium: 6.7 mmol/L (ref 3.5–5.3)
Sodium: 139 mmol/L (ref 135–146)
TOTAL PROTEIN: 7.2 g/dL (ref 6.1–8.1)

## 2015-03-20 MED ORDER — SODIUM POLYSTYRENE SULFONATE 15 GM/60ML PO SUSP: 15.0000 g | Freq: Two times a day (BID) | ORAL | Status: DC

## 2015-03-20 MED ORDER — HYDROCHLOROTHIAZIDE 25 MG PO TABS: 25.0000 mg | ORAL_TABLET | Freq: Every day | ORAL | Status: DC

## 2015-03-20 NOTE — Addendum Note (Signed)
Addended by: Dessa PhiFUNCHES, Riyad Keena on: 03/20/2015 05:57 PM   Modules accepted: Kipp BroodSmartSet

## 2015-03-20 NOTE — Addendum Note (Signed)
Addended by: Dessa PhiFUNCHES, Evalise Abruzzese on: 03/20/2015 04:12 PM   Modules accepted: Kipp BroodSmartSet

## 2015-03-20 NOTE — Progress Notes (Signed)
Patient ID: Marc Mata, male   DOB: [birth year], 39 y.o.   MRN: [id number] Patient returned for f/u EKG given elevated potassium obtained on today's BMP   F/u EKG obtained and reviewed by me. It is normal with normal T waves Repeat BMP drawn Patient given kayexalte from pharmacy. He will take 15 g x 2 doses.

## 2015-03-20 NOTE — Progress Notes (Signed)
Requesting Dental referral  No pain today  Tobacco user 3 cigarette per day  No suicidal thought in the past two weeks

## 2015-03-20 NOTE — Assessment & Plan Note (Signed)
A: dental carries with pain P: Dental referral  Brush at least twice daily Brush after sugary foods especially gummy bears Continue to cut down on smoking Floss before bed Call for worsening pain in jaw or swelling

## 2015-03-20 NOTE — Assessment & Plan Note (Signed)
A: BP well controlled P: continue HCTZ 25 mg daily CMP

## 2015-03-20 NOTE — Addendum Note (Signed)
Addended byManfred Arch: Lalita Ebel W on: 03/20/2015 05:45 PM   Modules accepted: Orders

## 2015-03-20 NOTE — Patient Instructions (Signed)
Wissam was seen today for dental problem and hypertension.  Diagnoses and all orders for this visit:  Pain of molar -     Ambulatory referral to Dentistry  Essential hypertension -     hydrochlorothiazide (HYDRODIURIL) 25 MG tablet; Take 1 tablet (25 mg total) by mouth daily. -     COMPLETE METABOLIC PANEL WITH GFR    Brush at least twice daily Brush after sugary foods especially gummy bears Continue to cut down on smoking Floss before bed Call for worsening pain in jaw or swelling   F/u in 3 month for HTN   Dr. Armen PickupFunches

## 2015-03-20 NOTE — Telephone Encounter (Signed)
Please call patient  Elevated Cr and severe hyperkalemia, slightly hemolysis noted. Patient was asymptomatic during today's OV.  Plan: Return to clinic today or tomorrow for   1. Repeat BMP 2. EKG 3. Kayexalate dose 15 mg twice daily   If he cannot return today or tomorrow AM, he is to go to the ED.

## 2015-03-20 NOTE — Telephone Encounter (Signed)
[name] with [facility] is calling to report critical lab on patient, Marc BattenKim verified patient's name and date of birth. Potassium is 6.7, critical high.  Nurse will verbally inform provider at this time and will forward message to provider.

## 2015-03-20 NOTE — Progress Notes (Signed)
Patient ID: Marc Fairlycey Macho, male   DOB: 1975/11/18, 39 y.o.   MRN: [id number]   Subjective:  Patient ID: Marc Mata, male    DOB: 1975/11/18  Age: 39 y.o. MRN: [id number]  CC: Dental Problem   HPI Marc Mata presents for    1. Dental pain; has poor dentition. Chipped molar. Pain. Intermittent swelling. No swelling or fever now. Smoking 2 cigs per day. No ETOH.  2. HTN: taking HCTZ. No HA, CP, SOB or edema. Eating low salt.  Social History  Substance Use Topics  . Smoking status: Current Every Day Smoker  . Smokeless tobacco: Never Used  . Alcohol Use: 0.0 oz/week    0 Standard drinks or equivalent per week     Comment: ocassionally    Outpatient Prescriptions Prior to Visit  Medication Sig Dispense Refill  . hydrochlorothiazide (HYDRODIURIL) 25 MG tablet TAKE 1 TABLET BY MOUTH DAILY 30 tablet 0  . acetaminophen-codeine (TYLENOL #3) 300-30 MG per tablet Take 1 tablet by mouth 2 (two) times daily. (Patient not taking: Reported on 03/20/2015) 30 tablet 0  . aspirin EC 81 MG tablet Take 1 tablet (81 mg total) by mouth daily. (Patient not taking: Reported on 03/20/2015) 90 tablet 1  . gabapentin (NEURONTIN) 300 MG capsule Take 1 capsule (300 mg total) by mouth 3 times/day as needed-between meals & bedtime. (Patient not taking: Reported on 03/20/2015) 90 capsule 1  . guaiFENesin-codeine 100-10 MG/5ML syrup Take 5-10 mLs by mouth every 6 (six) hours as needed for cough. (Patient not taking: Reported on 03/20/2015) 120 mL 0  . lisinopril (PRINIVIL,ZESTRIL) 40 MG tablet Take 1 tablet (40 mg total) by mouth daily. (Patient not taking: Reported on 03/20/2015) 90 tablet 3  . meloxicam (MOBIC) 15 MG tablet Take 1 tablet (15 mg total) by mouth daily. (Patient not taking: Reported on 03/20/2015) 15 tablet 0  . penicillin v potassium (VEETID) 500 MG tablet Take 1 tablet (500 mg total) by mouth 4 (four) times daily. (Patient not taking: Reported on 03/20/2015) 28 tablet 0  . triamcinolone cream  (KENALOG) 0.1 % Apply 1 application topically 2 (two) times daily. (Patient not taking: Reported on 03/20/2015) 30 g 0   No facility-administered medications prior to visit.    ROS Review of Systems  Constitutional: Negative for fever, chills, fatigue and unexpected weight change.  HENT: Positive for dental problem.   Eyes: Negative for visual disturbance.  Respiratory: Negative for cough and shortness of breath.   Cardiovascular: Negative for chest pain, palpitations and leg swelling.  Gastrointestinal: Negative for nausea, vomiting, abdominal pain, diarrhea, constipation and blood in stool.  Endocrine: Negative for polydipsia, polyphagia and polyuria.  Musculoskeletal: Negative for myalgias, back pain, arthralgias, gait problem and neck pain.  Skin: Negative for rash.  Allergic/Immunologic: Negative for immunocompromised state.  Hematological: Negative for adenopathy. Does not bruise/bleed easily.  Psychiatric/Behavioral: Negative for suicidal ideas, sleep disturbance and dysphoric mood. The patient is not nervous/anxious.     Objective:  BP 137/87 mmHg  Pulse 67  Temp(Src) 98.7 F (37.1 C) (Oral)  Resp 16  Ht 5\' 9"  (1.753 m)  Wt 173 lb (78.472 kg)  BMI 25.54 kg/m2  SpO2 100%  BP/Weight 03/20/2015 03/14/2015 02/08/2015  Systolic BP 137 171 140  Diastolic BP 87 104 84  Wt. (Lbs) 173 - -  BMI 25.54 - -   Physical Exam  Constitutional: He appears well-developed and well-nourished. No distress.  HENT:  Head: Normocephalic and atraumatic.  Mouth/Throat: Abnormal dentition. Dental caries present.  No dental abscesses.  Neck: Normal range of motion. Neck supple.  Cardiovascular: Normal rate, regular rhythm, normal heart sounds and intact distal pulses.   Pulmonary/Chest: Effort normal and breath sounds normal.  Musculoskeletal: He exhibits no edema.  Neurological: He is alert.  Skin: Skin is warm and dry. No rash noted. No erythema.  Psychiatric: He has a normal mood and  affect.     Assessment & Plan:   Problem List Items Addressed This Visit    HTN (hypertension) (Chronic)    A: BP well controlled P: continue HCTZ 25 mg daily CMP       Relevant Medications   hydrochlorothiazide (HYDRODIURIL) 25 MG tablet   Other Relevant Orders   COMPLETE METABOLIC PANEL WITH GFR   Pain of molar - Primary    A: dental carries with pain P: Dental referral  Brush at least twice daily Brush after sugary foods especially gummy bears Continue to cut down on smoking Floss before bed Call for worsening pain in jaw or swelling       Relevant Orders   Ambulatory referral to Dentistry    Other Visit Diagnoses    Healthcare maintenance        Relevant Orders    Flu Vaccine QUAD 36+ mos IM (Completed)       No orders of the defined types were placed in this encounter.    Follow-up: No Follow-up on file.   Dessa Phi MD

## 2015-03-20 NOTE — Telephone Encounter (Signed)
Nurse called patient at home number, reached patient's sister. Patient's sister explains this is patient's phone and patient is not with her, he is at his house. Nurse explained to patient's sister, [facility] is calling and it is urgent to speak to patient.  Sister requested nurse to tell her urgent message. Nurse explained, nurse can not give information to anyone other than the patient and stressed it was important to speak to patient.  Sister explained the only way to get in touch with patient is if she got in car and went to his house. Nurse asked how far patient's house is from sister.  Patient's sister explained she is not going to his house to tell him and told the nurse she would have to find him herself, referring to the nurse. Nurse called mobile number listed, reached voicemail, nurse left message for patient to [facility] at [phone]. Patient returned call as nurse typed note. Patient aware of elevated Cr and severe hyperkalemia, slightly hemolyzed. Nurse explained this means there is too much potassium in patients blood, this can effect the heart.  Patient aware of need to come to clinic to have a repeat BMP, EKG and kayexalate 15 mg twice daily. Patient aware of kayexalate will help remove potassium through bowel movements.  Patient voices understanding and is coming to clinic now.

## 2015-03-21 LAB — BASIC METABOLIC PANEL
BUN: 24 mg/dL (ref 7–25)
CO2: 27 mmol/L (ref 20–31)
Calcium: 9 mg/dL (ref 8.6–10.3)
Chloride: 104 mmol/L (ref 98–110)
Creat: 1.4 mg/dL — ABNORMAL HIGH (ref 0.60–1.35)
GLUCOSE: 98 mg/dL (ref 65–99)
POTASSIUM: 4.2 mmol/L (ref 3.5–5.3)
SODIUM: 139 mmol/L (ref 135–146)

## 2015-03-21 NOTE — Assessment & Plan Note (Signed)
Potassium on repeat BMP is 4.2 Patient to stop kayexalate if he has not already taken both doses Cr is elevated to 1.4, advise tight BP control. Avoid NSAIDs (aleve, ibuprofen etc.) avoid drugs including cocaine.  F/u next week for repeat BMP

## 2015-03-21 NOTE — Addendum Note (Signed)
Addended by: Dessa PhiFUNCHES, Jahziel Sinn on: 03/21/2015 08:30 AM   Modules accepted: Orders, SmartSet

## 2015-03-22 ENCOUNTER — Telehealth: Payer: Self-pay | Admitting: *Deleted

## 2015-03-22 NOTE — Telephone Encounter (Signed)
-----   Message from Dessa PhiJosalyn Funches, MD sent at 03/21/2015  8:29 AM EST ----- Please call patient  Potassium on repeat BMP is 4.2 Patient to stop kayexalate if he has not already taken both doses Cr is elevated to 1.4, advise tight BP control. Avoid NSAIDs (aleve, ibuprofen etc.) avoid drugs including cocaine.  F/u next week for repeat BMP

## 2015-03-22 NOTE — Telephone Encounter (Signed)
Date of birth verified by pt Lab results given  Pt advised to stop Kayexalate avoid NSAID. Avoid drugs including cocaine. F.U next week for repeat  Pt verbalized understanding

## 2015-03-29 ENCOUNTER — Other Ambulatory Visit: Payer: No Typology Code available for payment source

## 2015-05-07 MED FILL — HYDROCHLOROTHIAZIDE 25 MG T: 25 | 30 days supply | Qty: 30 | Fill #1

## 2015-06-08 MED FILL — HYDROCHLOROTHIAZIDE 25 MG T: 25 | 30 days supply | Qty: 30 | Fill #2

## 2015-07-09 MED FILL — HYDROCHLOROTHIAZIDE 25 MG T: 25 | 30 days supply | Qty: 30 | Fill #3

## 2015-08-10 MED FILL — ?HYDROCHLOROTHIAZIDE 25 MG: 25 MG | 30 days supply | Qty: 30 | Fill #4

## 2015-09-10 ENCOUNTER — Telehealth: Payer: Self-pay | Admitting: Family Medicine

## 2015-09-10 DIAGNOSIS — I10 Essential (primary) hypertension: Secondary | ICD-10-CM

## 2015-09-10 MED ORDER — HYDROCHLOROTHIAZIDE 25 MG PO TABS: 25.0000 mg | ORAL_TABLET | Freq: Every day | ORAL | Status: DC

## 2015-09-10 MED FILL — ?HYDROCHLOROTHIAZIDE 25 MG: 25 MG | 30 days supply | Qty: 30 | Fill #0

## 2015-09-10 NOTE — Telephone Encounter (Signed)
Patient called requesting hydrochlorothiazide and is needing cream to clear up rash on side. Please follow up.

## 2015-09-10 NOTE — Telephone Encounter (Signed)
Refilled hydrochlorothiazide x 30 days - patient needs an office visit for further refills.  Will defer rash management to Stella/Dr. Armen PickupFunches

## 2015-10-11 MED FILL — HYDROCHLOROTHIAZIDE 25 MG T: 25 | 30 days supply | Qty: 30 | Fill #5

## 2015-11-15 ENCOUNTER — Other Ambulatory Visit: Payer: Self-pay | Admitting: Family Medicine

## 2015-11-15 DIAGNOSIS — I10 Essential (primary) hypertension: Secondary | ICD-10-CM

## 2015-11-15 MED FILL — HYDROCHLOROTHIAZIDE 25 MG T: 25 | 30 days supply | Qty: 30 | Fill #0

## 2015-12-05 ENCOUNTER — Emergency Department (HOSPITAL_COMMUNITY): Payer: Self-pay

## 2015-12-05 ENCOUNTER — Emergency Department (HOSPITAL_COMMUNITY)
Admission: EM | Admit: 2015-12-05 | Discharge: 2015-12-05 | Disposition: A | Discharge status: Home / Self Care | Payer: Self-pay | Attending: Emergency Medicine | Admitting: Emergency Medicine

## 2015-12-05 ENCOUNTER — Encounter (HOSPITAL_COMMUNITY): Payer: Self-pay | Admitting: Emergency Medicine

## 2015-12-05 ENCOUNTER — Ambulatory Visit (HOSPITAL_COMMUNITY)
Admission: EM | Admit: 2015-12-05 | Discharge: 2015-12-05 | Disposition: A | Discharge status: Nursing Home (Includes ICF) | Payer: No Typology Code available for payment source

## 2015-12-05 DIAGNOSIS — Y999 Unspecified external cause status: Secondary | ICD-10-CM | POA: Insufficient documentation

## 2015-12-05 DIAGNOSIS — Z79899 Other long term (current) drug therapy: Secondary | ICD-10-CM | POA: Insufficient documentation

## 2015-12-05 DIAGNOSIS — Y929 Unspecified place or not applicable: Secondary | ICD-10-CM | POA: Insufficient documentation

## 2015-12-05 DIAGNOSIS — T189XXA Foreign body of alimentary tract, part unspecified, initial encounter: Secondary | ICD-10-CM | POA: Insufficient documentation

## 2015-12-05 DIAGNOSIS — X58XXXA Exposure to other specified factors, initial encounter: Secondary | ICD-10-CM | POA: Insufficient documentation

## 2015-12-05 DIAGNOSIS — I10 Essential (primary) hypertension: Secondary | ICD-10-CM | POA: Insufficient documentation

## 2015-12-05 DIAGNOSIS — Y939 Activity, unspecified: Secondary | ICD-10-CM | POA: Insufficient documentation

## 2015-12-05 DIAGNOSIS — F17210 Nicotine dependence, cigarettes, uncomplicated: Secondary | ICD-10-CM | POA: Insufficient documentation

## 2015-12-05 MED ORDER — GI COCKTAIL ~~LOC~~
30.0000 mL | Freq: Once | ORAL | Status: AC
  Administered 2015-12-05: 30 mL via ORAL
  Filled 2015-12-05: qty 30

## 2015-12-05 NOTE — Discharge Instructions (Signed)
1. Medications: usual home medications 2. Treatment: rest, drink plenty of fluids,  3. Follow Up: Please followup with your primary doctor in 1-2 days for discussion of your diagnoses and further evaluation after today's visit; if you do not have a primary care doctor use the resource guide provided to find one; Please return to the ER for persistent vomiting, bloody vomiting, severe abdominal pain, rectal bleeding or other concerns.

## 2015-12-05 NOTE — ED Triage Notes (Addendum)
Pt brought to triage room.  Pt is unsure if he swallowed a piece of glass, but did find a piece of glass on his tongue, that he has with him.  Shortly after the incident he developed chest heaviness in his lower left chest 3/10. He denies any HA, dizziness, numbness or tingling in his extremeties, radiation of the pain, SOB, or vision changes. Pt is stable for transfer to ED by shuttle.  VSS and no EKG changes per Dr. Chaney MallingMortenson.  Report was called to Marcelino DusterMichelle, First RN in the ED.

## 2015-12-05 NOTE — ED Triage Notes (Signed)
Pt from [facility] with c/o swallowing sharp clear objects in a drink he purchased.  Pt reports having worsening CP.  UC did an EKG but no x-ray.  Pt in NAD, A&O.
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
## 2015-12-21 ENCOUNTER — Other Ambulatory Visit: Payer: Self-pay | Admitting: Family Medicine

## 2015-12-21 DIAGNOSIS — I10 Essential (primary) hypertension: Secondary | ICD-10-CM

## 2015-12-28 ENCOUNTER — Other Ambulatory Visit: Payer: Self-pay | Admitting: Family Medicine

## 2015-12-28 DIAGNOSIS — I10 Essential (primary) hypertension: Secondary | ICD-10-CM

## 2015-12-28 NOTE — Telephone Encounter (Signed)
Pt called the office to request medication refill for hydrochlorothiazide (HYDRODIURIL) 25 MG tablet . Pt is aware that he needs appointment and tried to schedule but there aren't any appts available. Will call on Monday 9/25 to schedule. Please follow up.  Thank you.

## 2015-12-28 NOTE — Telephone Encounter (Signed)
Will route to PCP 

## 2015-12-31 MED FILL — HYDROCHLOROTHIAZIDE 25 MG T: 25 | 30 days supply | Qty: 30 | Fill #0

## 2016-01-07 ENCOUNTER — Telehealth: Payer: Self-pay | Admitting: Family Medicine

## 2016-01-07 NOTE — Telephone Encounter (Signed)
Patient came to the office to speak with nurse regarding the pain on his head that he is experiencing every time he coughs. He stated that he has been experiencing this for 2 two days now. Currently taking bp meds. Please follow up [phone]). Pt already has an appt on 10/10 but is very concerned.   Thank you

## 2016-01-08 NOTE — Telephone Encounter (Signed)
Pt was called on 10/02 and pt states that he was out of his BP medication for 2 weeks, pt has restated his BP medication and states he headaches are better.

## 2016-01-15 ENCOUNTER — Ambulatory Visit: Payer: No Typology Code available for payment source | Admitting: Family Medicine

## 2016-01-31 ENCOUNTER — Ambulatory Visit: Payer: No Typology Code available for payment source | Admitting: Family Medicine

## 2016-02-05 ENCOUNTER — Ambulatory Visit: Payer: Self-pay | Attending: Family Medicine | Admitting: Family Medicine

## 2016-02-05 ENCOUNTER — Ambulatory Visit: Payer: No Typology Code available for payment source | Admitting: Family Medicine

## 2016-02-05 ENCOUNTER — Encounter: Payer: Self-pay | Admitting: Family Medicine

## 2016-02-05 VITALS — BP 151/94 | HR 65 | Temp 98.2°F | Ht 69.0 in | Wt 170.6 lb

## 2016-02-05 DIAGNOSIS — I10 Essential (primary) hypertension: Secondary | ICD-10-CM | POA: Insufficient documentation

## 2016-02-05 DIAGNOSIS — R21 Rash and other nonspecific skin eruption: Secondary | ICD-10-CM | POA: Insufficient documentation

## 2016-02-05 DIAGNOSIS — B360 Pityriasis versicolor: Secondary | ICD-10-CM | POA: Insufficient documentation

## 2016-02-05 DIAGNOSIS — W3400XA Accidental discharge from unspecified firearms or gun, initial encounter: Secondary | ICD-10-CM

## 2016-02-05 DIAGNOSIS — G8929 Other chronic pain: Secondary | ICD-10-CM

## 2016-02-05 DIAGNOSIS — M546 Pain in thoracic spine: Secondary | ICD-10-CM | POA: Insufficient documentation

## 2016-02-05 DIAGNOSIS — Z79899 Other long term (current) drug therapy: Secondary | ICD-10-CM | POA: Insufficient documentation

## 2016-02-05 DIAGNOSIS — L309 Dermatitis, unspecified: Secondary | ICD-10-CM | POA: Insufficient documentation

## 2016-02-05 DIAGNOSIS — F17210 Nicotine dependence, cigarettes, uncomplicated: Secondary | ICD-10-CM | POA: Insufficient documentation

## 2016-02-05 DIAGNOSIS — Z23 Encounter for immunization: Secondary | ICD-10-CM

## 2016-02-05 LAB — BASIC METABOLIC PANEL WITH GFR
BUN: 16 mg/dL (ref 7–25)
CHLORIDE: 104 mmol/L (ref 98–110)
CO2: 29 mmol/L (ref 20–31)
Calcium: 9.1 mg/dL (ref 8.6–10.3)
Creat: 1.01 mg/dL (ref 0.60–1.35)
GFR, Est African American: >89 mL/min (ref >=60)
Glucose, Bld: 86 mg/dL (ref 65–99)
POTASSIUM: 3.7 mmol/L (ref 3.5–5.3)
Sodium: 139 mmol/L (ref 135–146)

## 2016-02-05 LAB — POCT GLYCOSYLATED HEMOGLOBIN (HGB A1C): Hemoglobin A1C: 5

## 2016-02-05 LAB — GLUCOSE, POCT (MANUAL RESULT ENTRY): POC GLUCOSE: 97 mg/dL (ref 70–99)

## 2016-02-05 MED ORDER — FLUCONAZOLE 150 MG PO TABS: 300.0000 mg | ORAL_TABLET | ORAL | 5 refills | Status: DC

## 2016-02-05 MED ORDER — TRIAMCINOLONE ACETONIDE 0.1 % EX CREA: 1.0000 "application " | TOPICAL_CREAM | Freq: Two times a day (BID) | CUTANEOUS | 0 refills | Status: DC

## 2016-02-05 MED ORDER — HYDROCHLOROTHIAZIDE 25 MG PO TABS: 25.0000 mg | ORAL_TABLET | Freq: Every day | ORAL | 5 refills | Status: DC

## 2016-02-05 MED FILL — FLUCONAZOLE 150 MG TABLET: 150 | 14 days supply | Qty: 4 | Fill #0

## 2016-02-05 MED FILL — HYDROCHLOROTHIAZIDE 25 MG T: 25 | 30 days supply | Qty: 30 | Fill #0

## 2016-02-05 MED FILL — TRIAMCINOLONE 0.1% CREAM: 0.1 | 15 days supply | Qty: 30 | Fill #0
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
## 2016-02-05 NOTE — Assessment & Plan Note (Signed)
R T12 level
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
## 2016-02-05 NOTE — Assessment & Plan Note (Signed)
Topical steroid. 

## 2016-02-05 NOTE — Assessment & Plan Note (Signed)
Oral diflucan. 

## 2016-02-05 NOTE — Assessment & Plan Note (Signed)
A: elevated Med: compliant P: Refilled HCTZ Close f/u in 2 weeks for recheck with plan for adding norvasc if still elevated
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
## 2016-02-13 ENCOUNTER — Telehealth: Payer: Self-pay

## 2016-02-13 NOTE — Telephone Encounter (Signed)
Pt was called on 11/08 and a VM was left informing pt of his lab results.

## 2016-02-19 ENCOUNTER — Ambulatory Visit: Payer: Self-pay | Attending: Family Medicine | Admitting: Family Medicine

## 2016-02-19 ENCOUNTER — Encounter: Payer: Self-pay | Admitting: Family Medicine

## 2016-02-19 VITALS — BP 142/90 | HR 88 | Temp 98.9°F | Ht 69.0 in | Wt 164.0 lb

## 2016-02-19 DIAGNOSIS — I10 Essential (primary) hypertension: Secondary | ICD-10-CM | POA: Insufficient documentation

## 2016-02-19 DIAGNOSIS — R21 Rash and other nonspecific skin eruption: Secondary | ICD-10-CM | POA: Insufficient documentation

## 2016-02-19 DIAGNOSIS — Z79899 Other long term (current) drug therapy: Secondary | ICD-10-CM | POA: Insufficient documentation

## 2016-02-19 DIAGNOSIS — F17210 Nicotine dependence, cigarettes, uncomplicated: Secondary | ICD-10-CM | POA: Insufficient documentation

## 2016-02-19 MED ORDER — AMLODIPINE BESYLATE 5 MG PO TABS: 5.0000 mg | ORAL_TABLET | Freq: Every day | ORAL | 3 refills | Status: DC

## 2016-02-19 MED FILL — AMLODIPINE BESYLATE 5 MG TA: 5 | 30 days supply | Qty: 30 | Fill #0

## 2016-02-19 NOTE — Patient Instructions (Addendum)
Olden was seen today for hypertension.  Diagnoses and all orders for this visit:  Essential hypertension -     amLODipine (NORVASC) 5 MG tablet; Take 1 tablet (5 mg total) by mouth daily.   F/u in 3 months for HTN  Dr. Armen PickupFunches

## 2016-02-19 NOTE — Progress Notes (Signed)
Pt is here today to follow up on HTN. Pt states that right toe has a cramp in it.
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
## 2016-02-20 ENCOUNTER — Ambulatory Visit: Payer: Self-pay | Attending: Internal Medicine

## 2016-02-23 ENCOUNTER — Encounter: Payer: Self-pay | Admitting: Family Medicine

## 2016-02-23 NOTE — Assessment & Plan Note (Signed)
A: elevated Med: compliant P: Continue HCTZ 25 mg daily Add norvasc 5 mg daily  Smoking cessation addressed

## 2016-03-11 ENCOUNTER — Telehealth: Payer: Self-pay | Admitting: Family Medicine

## 2016-03-11 DIAGNOSIS — I10 Essential (primary) hypertension: Secondary | ICD-10-CM

## 2016-03-11 NOTE — Telephone Encounter (Signed)
Patient is needing a refill for hydrochlorothiazide and amlodipine. Please follow up.

## 2016-03-12 MED ORDER — HYDROCHLOROTHIAZIDE 25 MG PO TABS: 25.0000 mg | ORAL_TABLET | Freq: Every day | ORAL | 0 refills | Status: DC

## 2016-03-12 MED ORDER — AMLODIPINE BESYLATE 5 MG PO TABS: 5.0000 mg | ORAL_TABLET | Freq: Every day | ORAL | 0 refills | Status: DC

## 2016-03-12 MED FILL — HYDROCHLOROTHIAZIDE 25 MG T: 25 | 30 days supply | Qty: 30 | Fill #0

## 2016-03-12 NOTE — Telephone Encounter (Signed)
Requested medications refilled 

## 2016-04-25 MED FILL — HYDROCHLOROTHIAZIDE 25 MG T: 25 | 30 days supply | Qty: 30 | Fill #1

## 2016-04-25 MED FILL — AMLODIPINE BESYLATE 5 MG TA: 5 | 30 days supply | Qty: 30 | Fill #0

## 2016-06-05 ENCOUNTER — Telehealth: Payer: Self-pay | Admitting: Family Medicine

## 2016-06-05 DIAGNOSIS — I10 Essential (primary) hypertension: Secondary | ICD-10-CM

## 2016-06-05 DIAGNOSIS — B360 Pityriasis versicolor: Secondary | ICD-10-CM

## 2016-06-05 MED ORDER — FLUCONAZOLE 150 MG PO TABS: 300.0000 mg | ORAL_TABLET | ORAL | 5 refills | Status: DC

## 2016-06-05 MED ORDER — HYDROCHLOROTHIAZIDE 25 MG PO TABS: 25.0000 mg | ORAL_TABLET | Freq: Every day | ORAL | 0 refills | Status: DC

## 2016-06-05 MED ORDER — AMLODIPINE BESYLATE 5 MG PO TABS: 5.0000 mg | ORAL_TABLET | Freq: Every day | ORAL | 0 refills | Status: DC

## 2016-06-05 MED FILL — AMLODIPINE BESYLATE 5 MG TA: 5 | 30 days supply | Qty: 30 | Fill #0

## 2016-06-05 MED FILL — HYDROCHLOROTHIAZIDE 25 MG T: 25 | 30 days supply | Qty: 30 | Fill #0

## 2016-06-05 NOTE — Telephone Encounter (Signed)
Pt presents to schedule appt and asking for a refill of his current meds. States that he has lost his meds, although there were only 6 or 7 pills remaining and he would like a refill sent to our pharmacy. Please f/u.

## 2016-06-05 NOTE — Telephone Encounter (Signed)
meds refilled 

## 2016-06-05 NOTE — Telephone Encounter (Signed)
Will route to PCP 

## 2016-06-12 ENCOUNTER — Encounter: Payer: Self-pay | Admitting: Family Medicine

## 2016-06-12 ENCOUNTER — Ambulatory Visit: Payer: Self-pay | Attending: Family Medicine | Admitting: Family Medicine

## 2016-06-12 VITALS — BP 141/93 | HR 65 | Temp 98.2°F | Ht 69.0 in | Wt 177.0 lb

## 2016-06-12 DIAGNOSIS — I10 Essential (primary) hypertension: Secondary | ICD-10-CM | POA: Insufficient documentation

## 2016-06-12 DIAGNOSIS — F17210 Nicotine dependence, cigarettes, uncomplicated: Secondary | ICD-10-CM | POA: Insufficient documentation

## 2016-06-12 DIAGNOSIS — Z79899 Other long term (current) drug therapy: Secondary | ICD-10-CM | POA: Insufficient documentation

## 2016-06-12 MED ORDER — HYDROCHLOROTHIAZIDE 25 MG PO TABS: 25.0000 mg | ORAL_TABLET | Freq: Every day | ORAL | 3 refills | Status: DC

## 2016-06-12 MED ORDER — AMLODIPINE BESYLATE 5 MG PO TABS: 5.0000 mg | ORAL_TABLET | Freq: Every day | ORAL | 3 refills | Status: DC
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
## 2016-06-12 NOTE — Patient Instructions (Addendum)
Marc Mata was seen today for hypertension.  Diagnoses and all orders for this visit:  Essential hypertension -     hydrochlorothiazide (HYDRODIURIL) 25 MG tablet; Take 1 tablet (25 mg total) by mouth daily. -     amLODipine (NORVASC) 5 MG tablet; Take 1 tablet (5 mg total) by mouth daily.   Take amlodipine 5 mg and HCTZ 25 mg daily  Smoking cessation support: smoking cessation hotline: 1-800-QUIT-NOW.  Smoking cessation classes are available through [facility]. Call [phone] or visit our website at [email or web address].    F/u in 2 weeks for BP check with RN F/u with me in 3 months for HTN   Dr. Armen PickupFunches

## 2016-06-12 NOTE — Assessment & Plan Note (Signed)
A: elevated BP Med: non compliant P: Restart  Close f/u in 2 weeks for recheck  If elevated at recheck increase Norvasc to 10 mg daily

## 2016-07-17 MED FILL — HYDROCHLOROTHIAZIDE 25 MG T: 25 | 30 days supply | Qty: 30 | Fill #1

## 2016-07-17 MED FILL — ?AMLODIPINE BESYLATE 5 MG T: 5 | 30 days supply | Qty: 30 | Fill #1

## 2016-07-18 IMAGING — CR DG CHEST 2V
2 series · 2 of 2 positions shown · non-contrast
Comparison: Prior chest x-ray 03/05/2013

CLINICAL DATA: 38-year-old male with left-sided chest pain

EXAM:
CHEST  2 VIEW

[w chest pa]
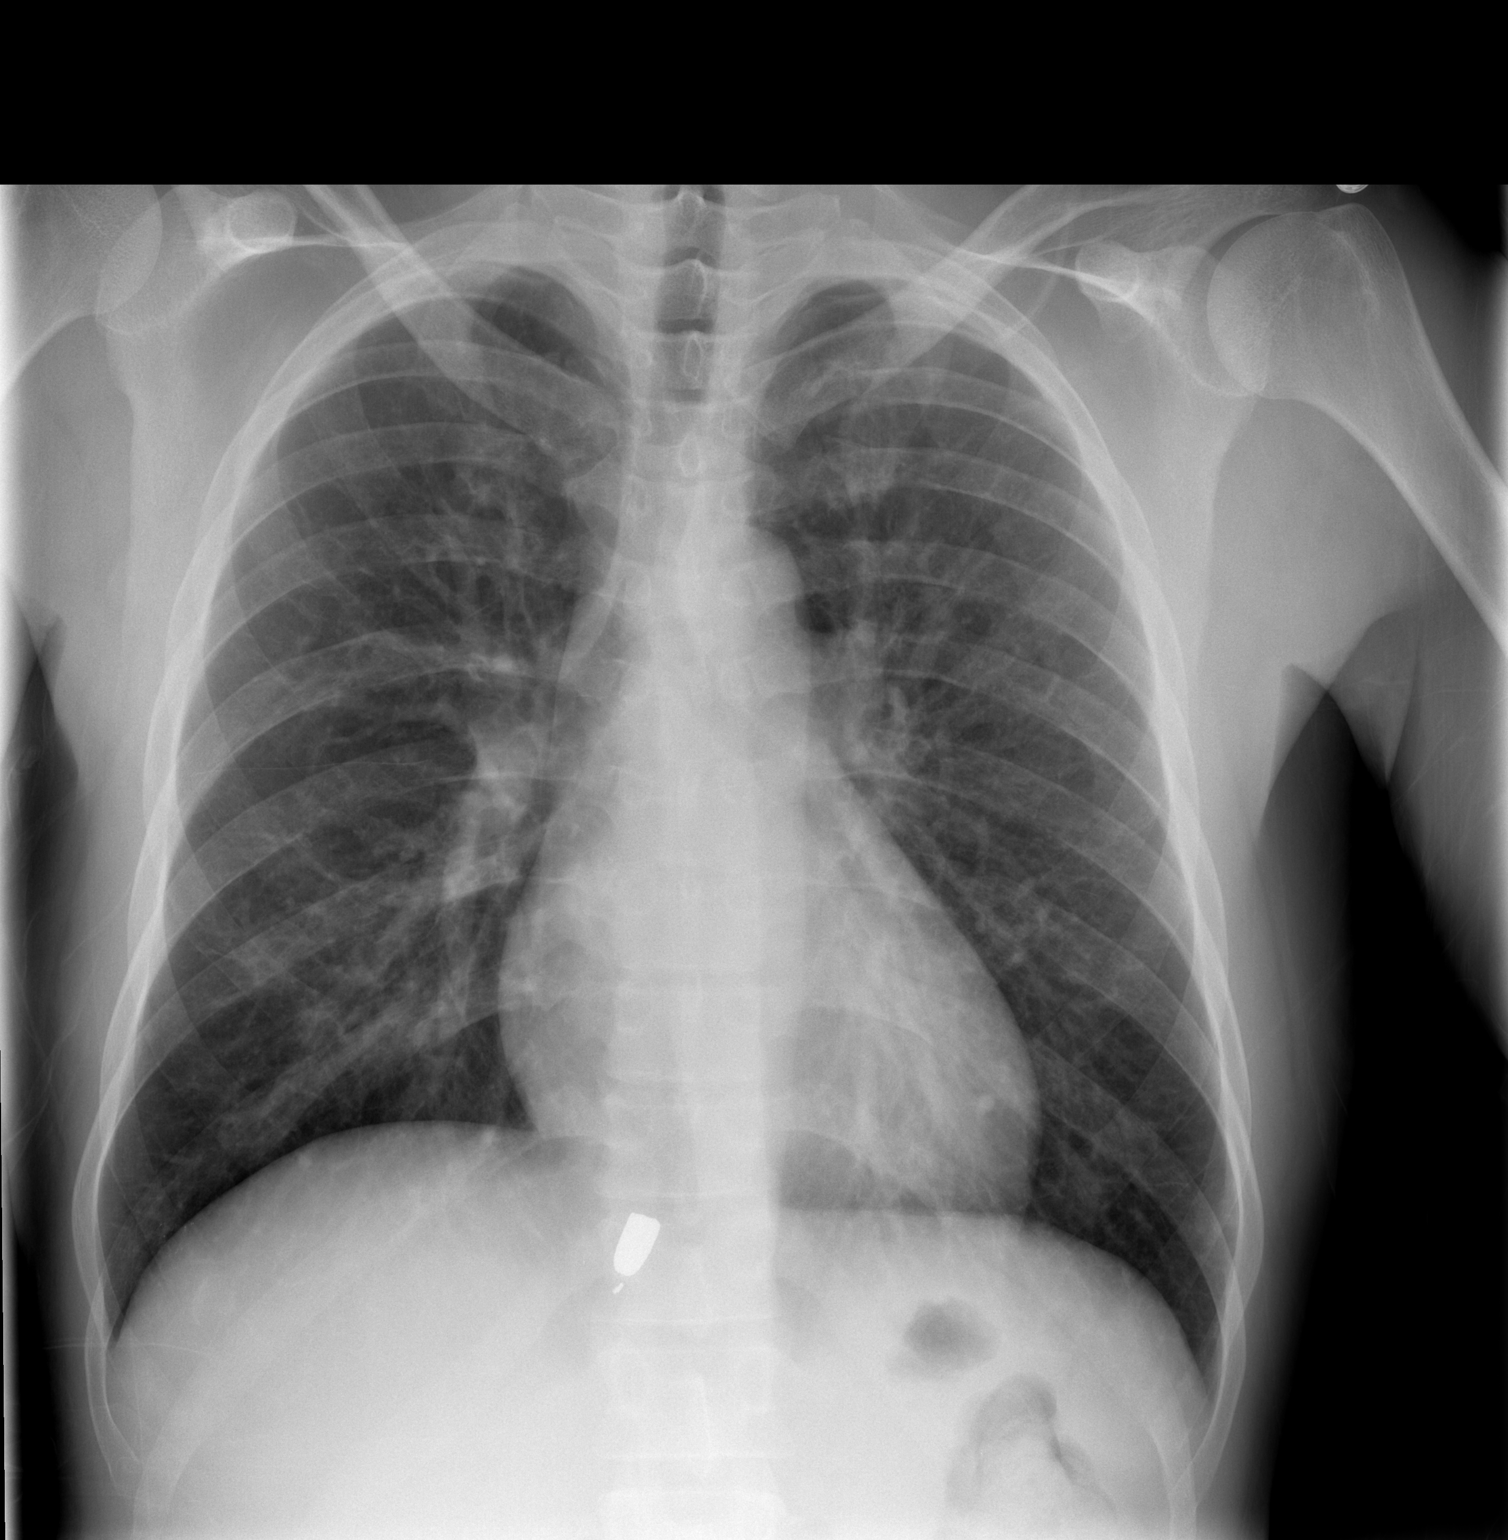

[w chest lat]
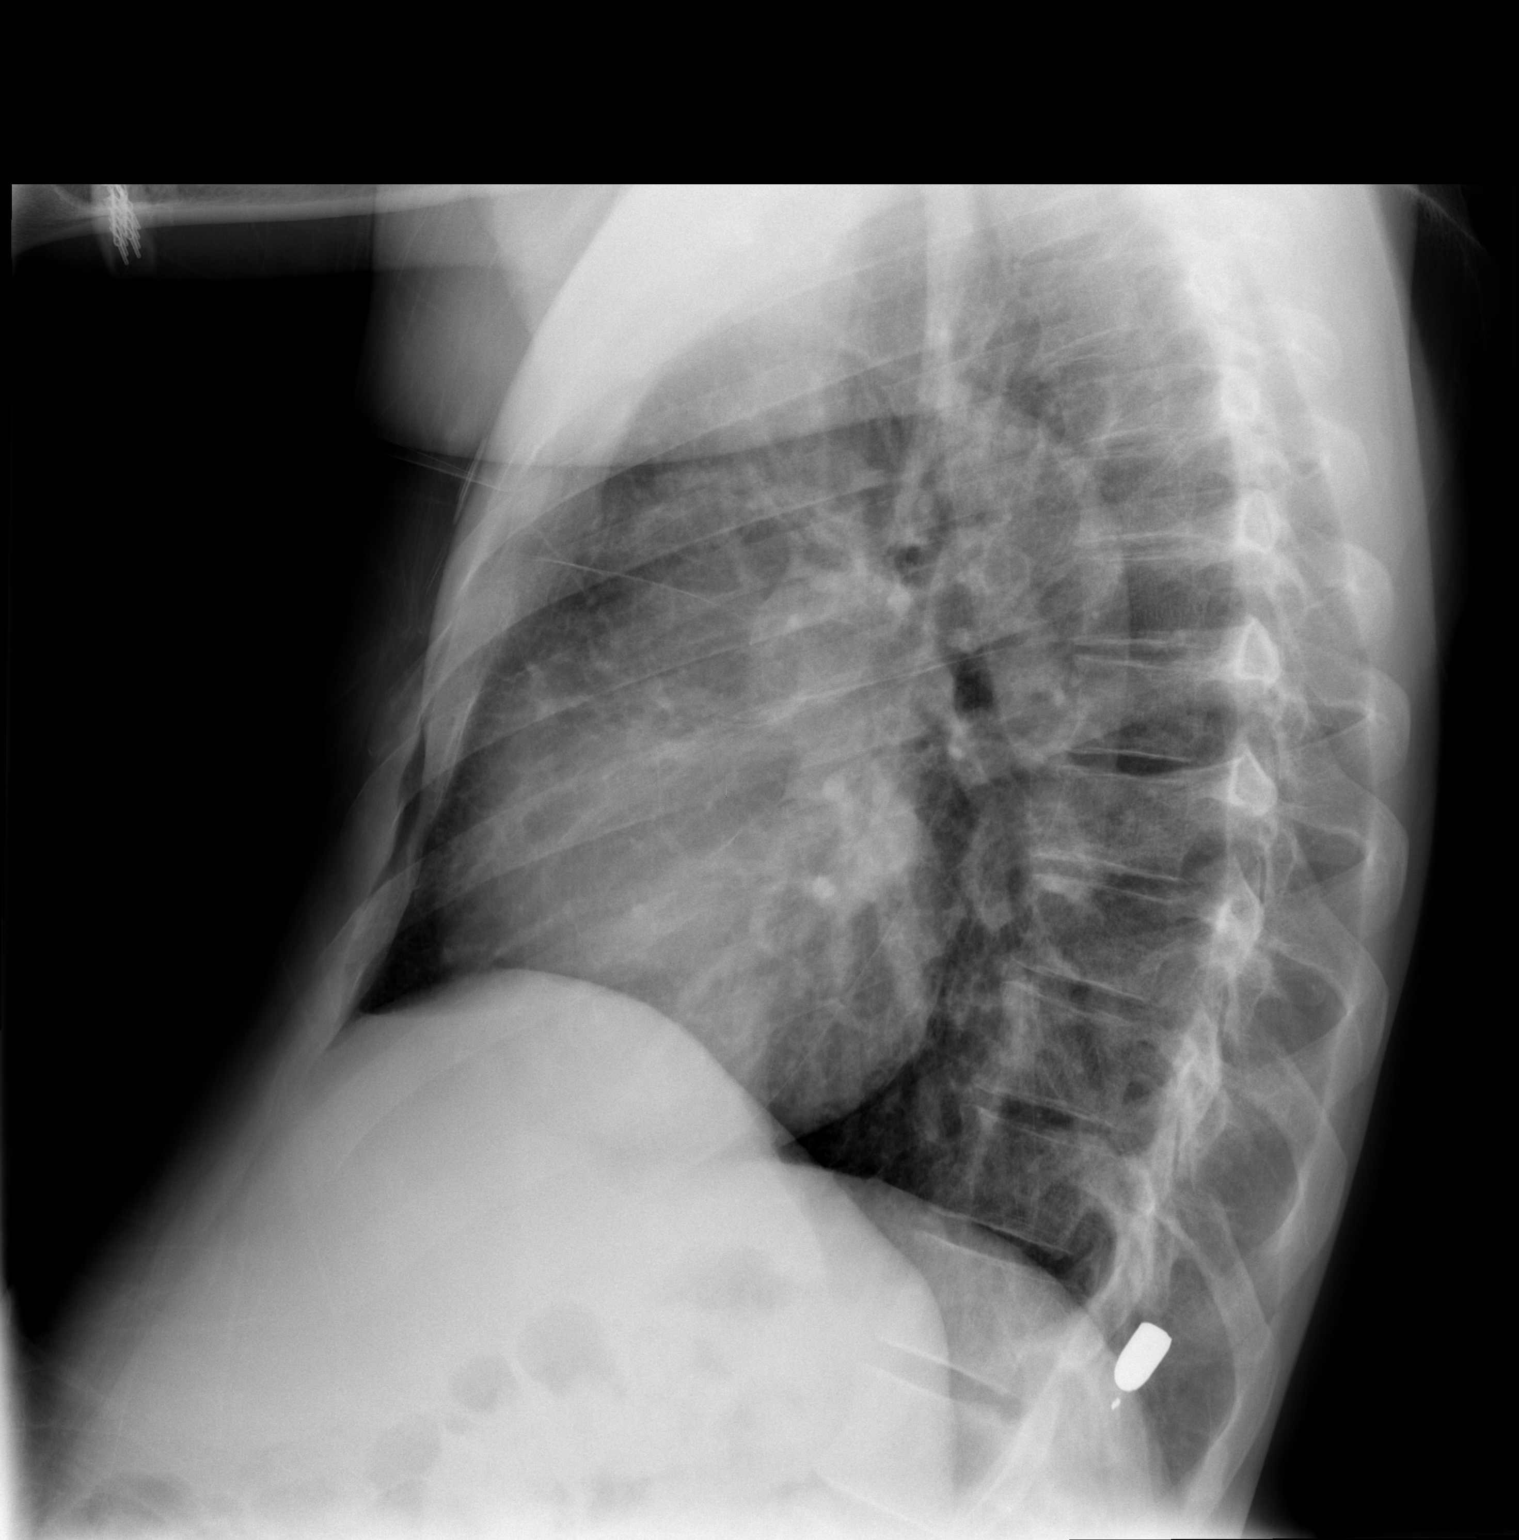

[2 of 2 positions shown; findings below may reference images not displayed]

FINDINGS: The lungs are clear and negative for focal airspace consolidation,
pulmonary edema or suspicious pulmonary nodule. No pleural effusion
or pneumothorax. Cardiac and mediastinal contours are within normal
limits. No acute fracture or lytic or blastic osseous lesions. The
visualized upper abdominal bowel gas pattern is unremarkable. Bolus
shaped metallic radiopacity noted in the soft tissues posterior to
T12.
IMPRESSION: No active cardiopulmonary disease.

## 2016-08-20 ENCOUNTER — Encounter: Payer: Self-pay | Admitting: Family Medicine

## 2016-08-20 MED FILL — HYDROCHLOROTHIAZIDE 25 MG T: 25 | 30 days supply | Qty: 30 | Fill #2

## 2016-08-20 MED FILL — ?AMLODIPINE BESYLATE 5 MG T: 5 | 30 days supply | Qty: 30 | Fill #2

## 2016-09-12 ENCOUNTER — Ambulatory Visit: Payer: Self-pay | Admitting: Family Medicine

## 2016-09-15 ENCOUNTER — Encounter: Payer: Self-pay | Admitting: Family Medicine

## 2016-09-15 ENCOUNTER — Other Ambulatory Visit: Payer: Self-pay | Admitting: Family Medicine

## 2016-09-15 ENCOUNTER — Ambulatory Visit: Payer: Self-pay | Attending: Family Medicine | Admitting: Family Medicine

## 2016-09-15 VITALS — BP 134/76 | HR 73 | Temp 97.7°F | Wt 174.2 lb

## 2016-09-15 DIAGNOSIS — I10 Essential (primary) hypertension: Secondary | ICD-10-CM | POA: Insufficient documentation

## 2016-09-15 DIAGNOSIS — L309 Dermatitis, unspecified: Secondary | ICD-10-CM

## 2016-09-15 DIAGNOSIS — J302 Other seasonal allergic rhinitis: Secondary | ICD-10-CM | POA: Insufficient documentation

## 2016-09-15 DIAGNOSIS — B360 Pityriasis versicolor: Secondary | ICD-10-CM | POA: Insufficient documentation

## 2016-09-15 DIAGNOSIS — T148XXS Other injury of unspecified body region, sequela: Secondary | ICD-10-CM | POA: Insufficient documentation

## 2016-09-15 DIAGNOSIS — F17210 Nicotine dependence, cigarettes, uncomplicated: Secondary | ICD-10-CM | POA: Insufficient documentation

## 2016-09-15 MED ORDER — GUAIFENESIN ER 600 MG PO TB12: 600.0000 mg | ORAL_TABLET | Freq: Two times a day (BID) | ORAL | 0 refills | Status: DC | PRN

## 2016-09-15 MED ORDER — FLUCONAZOLE 150 MG PO TABS: 300.0000 mg | ORAL_TABLET | ORAL | 0 refills | Status: AC

## 2016-09-15 MED ORDER — TRIAMCINOLONE ACETONIDE 0.1 % EX CREA: 1.0000 "application " | TOPICAL_CREAM | Freq: Two times a day (BID) | CUTANEOUS | 0 refills | Status: DC

## 2016-09-15 MED ORDER — LORATADINE 10 MG PO TABS: 10.0000 mg | ORAL_TABLET | Freq: Every day | ORAL | 2 refills | Status: DC

## 2016-09-15 NOTE — Assessment & Plan Note (Signed)
[place]reat with diflucan 300 mg weekly for two weeks

## 2016-09-15 NOTE — Addendum Note (Signed)
Addended by: Ronette DeterFARRINGTON, Valyncia Wiens V on: 09/15/2016 09:31 AM   Modules accepted: Orders

## 2016-09-15 NOTE — Assessment & Plan Note (Signed)
Exam consistent with rhinitis, non infectious Claritin and mucinex

## 2016-09-15 NOTE — Patient Instructions (Addendum)
Marc Mata was seen today for hypertension.  Diagnoses and all orders for this visit:  Essential hypertension  Tinea versicolor -     fluconazole (DIFLUCAN) 150 MG tablet; Take 2 tablets (300 mg total) by mouth once a week. For 2 weeks  Seasonal allergic rhinitis, unspecified trigger -     loratadine (CLARITIN) 10 MG tablet; Take 1 tablet (10 mg total) by mouth daily. -     guaiFENesin (MUCINEX) 600 MG 12 hr tablet; Take 1 tablet (600 mg total) by mouth 2 (two) times daily as needed.   F/u in 3 months for HTN

## 2016-09-15 NOTE — Assessment & Plan Note (Signed)
A: well controlled Med: compliant P: continue HCTZ 25 mg and Norvasc 5 mg
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
## 2016-10-10 MED FILL — ?TRIAMCINOLONE 0.1% CREAM: 0.1 | 30 days supply | Qty: 30 | Fill #0

## 2016-10-10 MED FILL — AMLODIPINE BESYLATE 5 MG TA: 5 | 30 days supply | Qty: 30 | Fill #0

## 2016-10-10 MED FILL — HYDROCHLOROTHIAZIDE 25 MG T: 25 | 30 days supply | Qty: 30 | Fill #0

## 2016-11-18 MED FILL — AMLODIPINE BESYLATE 5 MG TA: 5 | 30 days supply | Qty: 30 | Fill #1

## 2016-11-18 MED FILL — HYDROCHLOROTHIAZIDE 25 MG T: 25 | 30 days supply | Qty: 30 | Fill #1

## 2016-12-16 ENCOUNTER — Ambulatory Visit: Payer: Self-pay | Attending: Internal Medicine | Admitting: Internal Medicine

## 2016-12-16 ENCOUNTER — Encounter: Payer: Self-pay | Admitting: Internal Medicine

## 2016-12-16 VITALS — BP 120/79 | HR 78 | Temp 98.6°F | Resp 16 | Wt 168.0 lb

## 2016-12-16 DIAGNOSIS — Z23 Encounter for immunization: Secondary | ICD-10-CM

## 2016-12-16 DIAGNOSIS — J302 Other seasonal allergic rhinitis: Secondary | ICD-10-CM | POA: Insufficient documentation

## 2016-12-16 DIAGNOSIS — L309 Dermatitis, unspecified: Secondary | ICD-10-CM | POA: Insufficient documentation

## 2016-12-16 DIAGNOSIS — Z79899 Other long term (current) drug therapy: Secondary | ICD-10-CM | POA: Insufficient documentation

## 2016-12-16 DIAGNOSIS — I10 Essential (primary) hypertension: Secondary | ICD-10-CM | POA: Insufficient documentation

## 2016-12-16 DIAGNOSIS — B360 Pityriasis versicolor: Secondary | ICD-10-CM | POA: Insufficient documentation

## 2016-12-16 DIAGNOSIS — F17200 Nicotine dependence, unspecified, uncomplicated: Secondary | ICD-10-CM

## 2016-12-16 DIAGNOSIS — F17210 Nicotine dependence, cigarettes, uncomplicated: Secondary | ICD-10-CM | POA: Insufficient documentation

## 2016-12-16 DIAGNOSIS — F1910 Other psychoactive substance abuse, uncomplicated: Secondary | ICD-10-CM | POA: Insufficient documentation

## 2016-12-16 MED ORDER — FLUCONAZOLE 150 MG PO TABS: ORAL_TABLET | ORAL | 0 refills | Status: DC

## 2016-12-16 MED ORDER — TRIAMCINOLONE ACETONIDE 0.1 % EX CREA: 1.0000 "application " | TOPICAL_CREAM | Freq: Two times a day (BID) | CUTANEOUS | 2 refills | Status: DC

## 2016-12-16 MED ORDER — MUPIROCIN 2 % EX OINT: TOPICAL_OINTMENT | CUTANEOUS | 0 refills | Status: DC

## 2016-12-16 MED FILL — TRIAMCINOLONE 0.1% CREAM: 0.1 | 14 days supply | Qty: 30 | Fill #0

## 2016-12-16 MED FILL — MUPIROCIN 2% OINTMENT: 2 | 7 days supply | Qty: 22 | Fill #0

## 2016-12-16 MED FILL — FLUCONAZOLE 150 MG TABLET: 150 | 14 days supply | Qty: 4 | Fill #0
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
## 2016-12-16 NOTE — Patient Instructions (Addendum)
I have referred you to a dermatologist. Trying setting a quit date to stop smoking.  Try to quit the street drug use.   Influenza Virus Vaccine injection (Fluarix) What is this medicine? INFLUENZA VIRUS VACCINE (in floo EN zuh VAHY ruhs vak SEEN) helps to reduce the risk of getting influenza also known as the flu. This medicine may be used for other purposes; ask your health care provider or pharmacist if you have questions. COMMON BRAND NAME(S): Fluarix, Fluzone What should I tell my health care provider before I take this medicine? They need to know if you have any of these conditions: -bleeding disorder like hemophilia -fever or infection -Guillain-Barre syndrome or other neurological problems -immune system problems -infection with the human immunodeficiency virus (HIV) or AIDS -low blood platelet counts -multiple sclerosis -an unusual or allergic reaction to influenza virus vaccine, eggs, chicken proteins, latex, gentamicin, other medicines, foods, dyes or preservatives -pregnant or trying to get pregnant -breast-feeding How should I use this medicine? This vaccine is for injection into a muscle. It is given by a health care professional. A copy of Vaccine Information Statements will be given before each vaccination. Read this sheet carefully each time. The sheet may change frequently. Talk to your pediatrician regarding the use of this medicine in children. Special care may be needed. Overdosage: If you think you have taken too much of this medicine contact a poison control center or emergency room at once. NOTE: This medicine is only for you. Do not share this medicine with others. What if I miss a dose? This does not apply. What may interact with this medicine? -chemotherapy or radiation therapy -medicines that lower your immune system like etanercept, anakinra, infliximab, and adalimumab -medicines that treat or prevent blood clots like warfarin -phenytoin -steroid  medicines like prednisone or cortisone -theophylline -vaccines This list may not describe all possible interactions. Give your health care provider a list of all the medicines, herbs, non-prescription drugs, or dietary supplements you use. Also tell them if you smoke, drink alcohol, or use illegal drugs. Some items may interact with your medicine. What should I watch for while using this medicine? Report any side effects that do not go away within 3 days to your doctor or health care professional. Call your health care provider if any unusual symptoms occur within 6 weeks of receiving this vaccine. You may still catch the flu, but the illness is not usually as bad. You cannot get the flu from the vaccine. The vaccine will not protect against colds or other illnesses that may cause fever. The vaccine is needed every year. What side effects may I notice from receiving this medicine? Side effects that you should report to your doctor or health care professional as soon as possible: -allergic reactions like skin rash, itching or hives, swelling of the face, lips, or tongue Side effects that usually do not require medical attention (report to your doctor or health care professional if they continue or are bothersome): -fever -headache -muscle aches and pains -pain, tenderness, redness, or swelling at site where injected -weak or tired This list may not describe all possible side effects. Call your doctor for medical advice about side effects. You may report side effects to FDA at 1-800-FDA-1088. Where should I keep my medicine? This vaccine is only given in a clinic, pharmacy, doctor's office, or other health care setting and will not be stored at home. NOTE: This sheet is a summary. It may not cover all possible information. If you  have questions about this medicine, talk to your doctor, pharmacist, or health care provider.  2018 Elsevier/Gold Standard (2007-10-20 09:30:40)

## 2016-12-17 ENCOUNTER — Other Ambulatory Visit: Payer: Self-pay | Admitting: Internal Medicine

## 2016-12-17 LAB — COMPREHENSIVE METABOLIC PANEL
ALT: 24 IU/L (ref 0–44)
AST: 31 IU/L (ref 0–40)
Albumin/Globulin Ratio: 1.6 (ref 1.2–2.2)
Albumin: 4.5 g/dL (ref 3.5–5.5)
Alkaline Phosphatase: 71 IU/L (ref 39–117)
BUN/Creatinine Ratio: 16 (ref 9–20)
BUN: 16 mg/dL (ref 6–24)
Bilirubin Total: 0.5 mg/dL (ref 0.0–1.2)
CALCIUM: 9.9 mg/dL (ref 8.7–10.2)
CO2: 24 mmol/L (ref 20–29)
CREATININE: 1.02 mg/dL (ref 0.76–1.27)
Chloride: 96 mmol/L (ref 96–106)
GFR calc Af Amer: 106 mL/min/{1.73_m2} (ref >59)
GFR, EST NON AFRICAN AMERICAN: 92 mL/min/{1.73_m2} (ref >59)
GLUCOSE: 83 mg/dL (ref 65–99)
Globulin, Total: 2.9 g/dL (ref 1.5–4.5)
Potassium: 4.2 mmol/L (ref 3.5–5.2)
Sodium: 139 mmol/L (ref 134–144)
Total Protein: 7.4 g/dL (ref 6.0–8.5)

## 2016-12-17 LAB — CBC
HEMATOCRIT: 44.1 % (ref 37.5–51.0)
HEMOGLOBIN: 15.4 g/dL (ref 13.0–17.7)
MCH: 30.1 pg (ref 26.6–33.0)
MCHC: 34.9 g/dL (ref 31.5–35.7)
MCV: 86 fL (ref 79–97)
Platelets: 202 10*3/uL (ref 150–379)
RBC: 5.12 x10E6/uL (ref 4.14–5.80)
RDW: 13.8 % (ref 12.3–15.4)
WBC: 7 10*3/uL (ref 3.4–10.8)

## 2016-12-17 LAB — HEPATITIS C ANTIBODY (REFLEX)

## 2016-12-17 LAB — LIPID PANEL
CHOL/HDL RATIO: 4.1 ratio (ref 0.0–5.0)
Cholesterol, Total: 200 mg/dL — ABNORMAL HIGH (ref 100–199)
HDL: 49 mg/dL (ref >39)
LDL Calculated: 132 mg/dL — ABNORMAL HIGH (ref 0–99)
Triglycerides: 93 mg/dL (ref 0–149)
VLDL CHOLESTEROL CAL: 19 mg/dL (ref 5–40)

## 2016-12-17 LAB — HIV ANTIBODY (ROUTINE TESTING W REFLEX): HIV SCREEN 4TH GENERATION: NONREACTIVE

## 2016-12-17 LAB — HCV COMMENT:

## 2016-12-17 MED ORDER — ATORVASTATIN CALCIUM 10 MG PO TABS: 10.0000 mg | ORAL_TABLET | Freq: Every day | ORAL | 1 refills | Status: DC

## 2016-12-18 ENCOUNTER — Telehealth: Payer: Self-pay

## 2016-12-18 NOTE — Telephone Encounter (Signed)
Contacted pt to go over lab results pt is aware of results but he is confused and would like the provider to contact him to get a better understanding of his results. Please f/u

## 2016-12-18 NOTE — Telephone Encounter (Signed)
PC placed to pt this evening. Call dropped 2x. Tried again and did get voice mail. I left message that I was returning call. Can call back next week as office closed tomorrow.

## 2016-12-19 ENCOUNTER — Telehealth: Payer: Self-pay | Admitting: Internal Medicine

## 2016-12-19 NOTE — Telephone Encounter (Signed)
PC placed to pt again today. I left message again on cell phone  that I was just trying to f/u with him.

## 2016-12-29 MED FILL — HYDROCHLOROTHIAZIDE 25 MG T: 25 | 30 days supply | Qty: 30 | Fill #2

## 2016-12-29 MED FILL — AMLODIPINE BESYLATE 5 MG TA: 5 | 30 days supply | Qty: 30 | Fill #2

## 2016-12-30 MED FILL — ?ATORVASTATIN 10 MG TABLET: 10 | 30 days supply | Qty: 30 | Fill #0

## 2017-02-06 MED FILL — HYDROCHLOROTHIAZIDE 25 MG T: 25 | 30 days supply | Qty: 30 | Fill #3

## 2017-02-06 MED FILL — ?ATORVASTATIN 10 MG TABLET: 10 | 30 days supply | Qty: 30 | Fill #1

## 2017-02-06 MED FILL — AMLODIPINE BESYLATE 5 MG TA: 5 | 30 days supply | Qty: 30 | Fill #3

## 2017-02-12 ENCOUNTER — Telehealth: Payer: Self-pay | Admitting: Internal Medicine

## 2017-02-12 NOTE — Telephone Encounter (Signed)
Pt called to speak with the financial dep. Since he has some question about renew the OC, please follow up

## 2017-02-16 ENCOUNTER — Ambulatory Visit: Payer: Self-pay

## 2017-02-28 ENCOUNTER — Emergency Department (HOSPITAL_COMMUNITY): Payer: Self-pay

## 2017-02-28 ENCOUNTER — Encounter (HOSPITAL_COMMUNITY): Payer: Self-pay

## 2017-02-28 ENCOUNTER — Other Ambulatory Visit: Payer: Self-pay

## 2017-02-28 ENCOUNTER — Emergency Department (HOSPITAL_COMMUNITY)
Admission: EM | Admit: 2017-02-28 | Discharge: 2017-02-28 | Disposition: A | Discharge status: Home / Self Care | Payer: Self-pay | Attending: Emergency Medicine | Admitting: Emergency Medicine

## 2017-02-28 DIAGNOSIS — Z79899 Other long term (current) drug therapy: Secondary | ICD-10-CM | POA: Insufficient documentation

## 2017-02-28 DIAGNOSIS — F17210 Nicotine dependence, cigarettes, uncomplicated: Secondary | ICD-10-CM | POA: Insufficient documentation

## 2017-02-28 DIAGNOSIS — I10 Essential (primary) hypertension: Secondary | ICD-10-CM | POA: Insufficient documentation

## 2017-02-28 DIAGNOSIS — J40 Bronchitis, not specified as acute or chronic: Secondary | ICD-10-CM | POA: Insufficient documentation

## 2017-02-28 MED ORDER — ALBUTEROL SULFATE HFA 108 (90 BASE) MCG/ACT IN AERS: 1.0000 | INHALATION_SPRAY | Freq: Four times a day (QID) | RESPIRATORY_TRACT | 0 refills | Status: DC | PRN

## 2017-02-28 MED ORDER — BENZONATATE 100 MG PO CAPS: 100.0000 mg | ORAL_CAPSULE | Freq: Three times a day (TID) | ORAL | 0 refills | Status: DC

## 2017-02-28 MED ORDER — ALBUTEROL SULFATE HFA 108 (90 BASE) MCG/ACT IN AERS
1.0000 | INHALATION_SPRAY | Freq: Four times a day (QID) | RESPIRATORY_TRACT | Status: DC | PRN
  Administered 2017-02-28: 2 via RESPIRATORY_TRACT
  Filled 2017-02-28: qty 6.7

## 2017-02-28 NOTE — Discharge Instructions (Signed)
Please read and follow all provided instructions.  Your diagnoses today include:  1. Bronchitis     Tests performed today include: Vital signs. See below for your results today. . Chest xray - did not show evidence of infection.   Medications prescribed/advised: 1. Musinex [Guaifenesin] as a decongestant [thin mucus - you have to be well hydrated when taking this for it to work - you can find this over the counter.  2. Tylenol for fever/pain and Motrin/Ibuprofen for muscle aches 3. Cough Suppressant: Take as directed.  4. Albuterol inhaler - this medication will help open up your airway. Use inhaler as follows: 1-2 puffs with spacer every 4 hours as needed for wheezing, cough, or shortness of breath.    Home care instructions:  An upper respiratory infection (URI) is also sometimes known as the common cold. Most people improve within 1 week, but symptoms can last up to 2 weeks. A residual cough may last even longer.   URI is most commonly caused by a virus. Viruses are NOT treated with antibiotics. You can easily spread the virus to others by oral contact. This includes kissing, sharing a glass, coughing, or sneezing. Touching your mouth or nose and then touching a surface, which is then touched by another person, can also spread the virus.   TREATMENT  Treatment is directed at relieving symptoms. There is no cure. Antibiotics are not effective, because the infection is caused by a virus, not by bacteria. Treatment may include:  Increased fluid intake. Sports drinks offer valuable electrolytes, sugars, and fluids.  Breathing heated mist or steam (vaporizer or shower).  Eating chicken soup or other clear broths, and maintaining good nutrition.  Getting plenty of rest.  Using gargles or lozenges for comfort.  Controlling fevers with ibuprofen or acetaminophen as directed by your caregiver.  Increasing usage of your inhaler if you have asthma.  Return to work when your temperature has  returned to normal.   Follow-up instructions: Followup with your primary care doctor in 3 days (03/03/17) if your symptoms persist.  Your more than welcome to return to the emergency department if symptoms worsen or become concerning.  Return instructions:  Please return to the Emergency Department if you do not get better, if you get worse, or new symptoms OR  - Fever (temperature greater than 101.34F)  - Bleeding that does not stop with holding pressure to the area    -Severe pain (please note that you may be more sore the day after your accident)  - Chest Pain  - Difficulty breathing (worsening shortness of breath with sputum production may  be a sign of pneumonia.   - Severe nausea or vomiting  - Inability to tolerate food and liquids  - Passing out  - Skin becoming red around your wounds  - Change in mental status (confusion or lethargy)  - New numbness or weakness     -You develop fever, swollen neck glands, pain with swallowing or white areas on  the back of your throat. This may be a sign of strep throat.  Please return if you have any other emergent concerns.  Additional Information:  Your vital signs today were: BP 137/87 (BP Location: Right Arm)    Pulse 79    Temp 98.8 F (37.1 C) (Oral)    Resp 12    SpO2 100%  If your blood pressure (BP) was elevated above 135/85 this visit, please have this repeated by your doctor within one month.
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
## 2017-02-28 NOTE — ED Triage Notes (Signed)
Pt reports productive cough, clear phlegm, chest congestion, runny nose.  NO respiratory or swallowing difficulties.  Pt does not remember when cough started.

## 2017-02-28 NOTE — ED Notes (Signed)
Declined W/C at D/C and was escorted to lobby by RN.Declined W/C at D/C and was escorted to lobby by RN. 

## 2017-03-06 ENCOUNTER — Ambulatory Visit: Payer: Self-pay

## 2017-03-09 NOTE — Telephone Encounter (Signed)
Pt came as a walkin and met with Deisy to renew his discount

## 2017-03-17 ENCOUNTER — Ambulatory Visit: Payer: Self-pay | Admitting: Internal Medicine

## 2017-04-02 ENCOUNTER — Telehealth: Payer: Self-pay | Admitting: Internal Medicine

## 2017-04-02 DIAGNOSIS — I10 Essential (primary) hypertension: Secondary | ICD-10-CM

## 2017-04-02 MED ORDER — AMLODIPINE BESYLATE 5 MG PO TABS: 5.0000 mg | ORAL_TABLET | Freq: Every day | ORAL | 0 refills | Status: DC

## 2017-04-02 MED ORDER — HYDROCHLOROTHIAZIDE 25 MG PO TABS: 25.0000 mg | ORAL_TABLET | Freq: Every day | ORAL | 0 refills | Status: DC

## 2017-04-02 MED FILL — HYDROCHLOROTHIAZIDE 25 MG T: 25 | 30 days supply | Qty: 30 | Fill #0

## 2017-04-02 MED FILL — AMLODIPINE BESYLATE 5 MG TA: 5 | 30 days supply | Qty: 30 | Fill #0

## 2017-04-02 NOTE — Telephone Encounter (Signed)
Refilled

## 2017-04-02 NOTE — Telephone Encounter (Signed)
Pt. Called requesting a refill on the following medications:   amLODipine (NORVASC) 5 MG tablet  hydrochlorothiazide (HYDRODIURIL) 25 MG tablet  Pt. Uses [facility] pharmacy. Please f/u with pt.

## 2017-04-09 ENCOUNTER — Ambulatory Visit: Payer: Self-pay | Admitting: Internal Medicine

## 2017-04-20 ENCOUNTER — Encounter: Payer: Self-pay | Admitting: Internal Medicine

## 2017-04-20 ENCOUNTER — Other Ambulatory Visit: Payer: Self-pay

## 2017-04-20 ENCOUNTER — Ambulatory Visit: Payer: Self-pay | Attending: Internal Medicine | Admitting: Internal Medicine

## 2017-04-20 VITALS — BP 146/91 | HR 59 | Temp 98.6°F | Resp 16 | Wt 171.4 lb

## 2017-04-20 DIAGNOSIS — F17200 Nicotine dependence, unspecified, uncomplicated: Secondary | ICD-10-CM

## 2017-04-20 DIAGNOSIS — Z8249 Family history of ischemic heart disease and other diseases of the circulatory system: Secondary | ICD-10-CM | POA: Insufficient documentation

## 2017-04-20 DIAGNOSIS — Z833 Family history of diabetes mellitus: Secondary | ICD-10-CM | POA: Insufficient documentation

## 2017-04-20 DIAGNOSIS — R079 Chest pain, unspecified: Secondary | ICD-10-CM

## 2017-04-20 DIAGNOSIS — L309 Dermatitis, unspecified: Secondary | ICD-10-CM

## 2017-04-20 DIAGNOSIS — I10 Essential (primary) hypertension: Secondary | ICD-10-CM

## 2017-04-20 DIAGNOSIS — E785 Hyperlipidemia, unspecified: Secondary | ICD-10-CM | POA: Insufficient documentation

## 2017-04-20 DIAGNOSIS — F17210 Nicotine dependence, cigarettes, uncomplicated: Secondary | ICD-10-CM | POA: Insufficient documentation

## 2017-04-20 DIAGNOSIS — Z79899 Other long term (current) drug therapy: Secondary | ICD-10-CM | POA: Insufficient documentation

## 2017-04-20 DIAGNOSIS — J302 Other seasonal allergic rhinitis: Secondary | ICD-10-CM | POA: Insufficient documentation
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
## 2017-04-20 NOTE — Patient Instructions (Addendum)
Take your  Blood pressure medications daily as prescribed.  Uncontrolled blood pressure, smoking and high cholesterol pu you at risk for heart disease.  I encourage you to stop smoking.  Take a baby aspirin daily.  I have referred you to cardiology for further evaluation.

## 2017-04-21 LAB — LIPID PANEL
CHOL/HDL RATIO: 4 ratio (ref 0.0–5.0)
CHOLESTEROL TOTAL: 194 mg/dL (ref 100–199)
HDL: 49 mg/dL (ref >39)
LDL CALC: 122 mg/dL — AB (ref 0–99)
TRIGLYCERIDES: 113 mg/dL (ref 0–149)
VLDL CHOLESTEROL CAL: 23 mg/dL (ref 5–40)

## 2017-04-22 ENCOUNTER — Telehealth: Payer: Self-pay

## 2017-04-22 NOTE — Telephone Encounter (Signed)
Contacted pt to go over lab results pt is aware of results.   Dr. Laural BenesJohnson pt only question is do you want him to continue to take the cholesterol medication?

## 2017-04-24 NOTE — Addendum Note (Signed)
Addended by: Jonah BlueJOHNSON, Emera Bussie B on: 04/24/2017 07:49 AM   Modules accepted: Orders

## 2017-04-24 NOTE — Telephone Encounter (Signed)
Contacted pt and left a detailed message informing pt per Dr. Laural BenesJohnson it is to okay to continue to observe off the medication and if he has questions or concerns to give me a call

## 2017-05-08 ENCOUNTER — Telehealth: Payer: Self-pay | Admitting: Internal Medicine

## 2017-05-08 DIAGNOSIS — I10 Essential (primary) hypertension: Secondary | ICD-10-CM

## 2017-05-08 MED ORDER — AMLODIPINE BESYLATE 5 MG PO TABS: 5.0000 mg | ORAL_TABLET | Freq: Every day | ORAL | 2 refills | Status: DC

## 2017-05-08 MED ORDER — HYDROCHLOROTHIAZIDE 25 MG PO TABS: 25.0000 mg | ORAL_TABLET | Freq: Every day | ORAL | 2 refills | Status: DC

## 2017-05-08 MED FILL — ?AMLODIPINE BESYLATE 5 MG T: 5 MG | 30 days supply | Qty: 30 | Fill #0

## 2017-05-08 MED FILL — HYDROCHLOROTHIAZIDE 25 MG T: 25 | 30 days supply | Qty: 30 | Fill #0

## 2017-05-08 NOTE — Telephone Encounter (Signed)
Refilled

## 2017-05-08 NOTE — Telephone Encounter (Signed)
PATIENT CALLED AND REQUESTED FOR LISTED MEDICATIONS TO BE REFILLED.  amLODipine (NORVASC) 5 MG tablet [[id number] hydrochlorothiazide (HYDRODIURIL) 25 MG tablet [[id number]][[id number]]  PLEASE SEND MEDICATIONS TO [facility]

## 2017-05-26 ENCOUNTER — Ambulatory Visit (INDEPENDENT_AMBULATORY_CARE_PROVIDER_SITE_OTHER): Payer: No Typology Code available for payment source | Admitting: Cardiovascular Disease

## 2017-05-26 ENCOUNTER — Encounter: Payer: Self-pay | Admitting: Cardiovascular Disease

## 2017-05-26 DIAGNOSIS — I10 Essential (primary) hypertension: Secondary | ICD-10-CM

## 2017-05-26 DIAGNOSIS — E7800 Pure hypercholesterolemia, unspecified: Secondary | ICD-10-CM

## 2017-05-26 DIAGNOSIS — R0789 Other chest pain: Secondary | ICD-10-CM

## 2017-05-26 NOTE — Progress Notes (Signed)
05/26/2017 Marc Mata   06/04/1975  [id number]  Primary Physician Marcine Matar, MD Primary Cardiologist: Runell Gess MD Nicholes Calamity, [place]  HPI:  Marc Mata is a 42 y.o. 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 He was referred by Dr. Rodena Medin, his PCP, for atypical chest pain and "an abnormal EKG. He has a history of essential hypertension, hyperlipidemia, continued tobacco abuse and polysubstance abuse including cocaine and marijuana. He doesn't atypical chest pain which is constant and precordial. This pain does not sound ischemic in nature.    Current Meds  Medication Sig  . amLODipine (NORVASC) 5 MG tablet Take 1 tablet (5 mg total) by mouth daily.  . hydrochlorothiazide (HYDRODIURIL) 25 MG tablet Take 1 tablet (25 mg total) by mouth daily.     No Known Allergies  Social History   Socioeconomic History  . Marital status: Single    Spouse name: Not on file  . Number of children: Not on file  . Years of education: Not on file  . Highest education level: Not on file  Social Needs  . Financial resource strain: Not on file  . Food insecurity - worry: Not on file  . Food insecurity - inability: Not on file  . Transportation needs - medical: Not on file  . Transportation needs - non-medical: Not on file  Occupational History  . Not on file  Tobacco Use  . Smoking status: Current Every Day Smoker    Packs/day: 0.25    Types: Cigarettes  . Smokeless tobacco: Never Used  Substance and Sexual Activity  . Alcohol use: Yes    Alcohol/week: 0.0 oz    Comment: ocassionally  . Drug use: No    Comment: Cocaine-last used 02/17/2015 Marijuana  "this morning" 03/20/2015  . Sexual activity: Yes  Other Topics Concern  . Not on file  Social History Narrative  . Not on file     Review of Systems: General: negative for chills, fever, night sweats or weight changes.  Cardiovascular: negative for chest  pain, dyspnea on exertion, edema, orthopnea, palpitations, paroxysmal nocturnal dyspnea or shortness of breath Dermatological: negative for rash Respiratory: negative for cough or wheezing Urologic: negative for hematuria Abdominal: negative for nausea, vomiting, diarrhea, bright red blood per rectum, melena, or hematemesis Neurologic: negative for visual changes, syncope, or dizziness All other systems reviewed and are otherwise negative except as noted above.    Blood pressure 134/74, pulse 72, height 5\' 9"  (1.753 m), weight 168 lb (76.2 kg), SpO2 99 %.  General appearance: alert and no distress Neck: no adenopathy, no carotid bruit, no JVD, supple, symmetrical, trachea midline and thyroid not enlarged, symmetric, no tenderness/mass/nodules Lungs: clear to auscultation bilaterally Heart: regular rate and rhythm, S1, S2 normal, no murmur, click, rub or gallop Extremities: extremities normal, atraumatic, no cyanosis or edema Pulses: 2+ and symmetric Skin: Skin color, texture, turgor normal. No rashes or lesions Neurologic: Alert and oriented X 3, normal strength and tone. Normal symmetric reflexes. Normal coordination and gait  EKG not performed today  ASSESSMENT AND PLAN:   HTN (hypertension) History of essential hypertension blood pressures measured to 134/74. He is on hydrochlorothiazide and amlodipine. Continue current meds at current dosing.  Atypical chest pain Patient complains of atypical chest pain will last several weeks which is constant and precordial. This does not sound cardiac but more musculoskeletal.  Hyperlipidemia History of hyperlipidemia previously on statin drug which was recently  discontinued.      Runell GessJonathan J. Berry MD FACP,FACC,FAHA, [facility] 05/26/2017 12:02 PM

## 2017-05-26 NOTE — Assessment & Plan Note (Signed)
Patient complains of atypical chest pain will last several weeks which is constant and precordial. This does not sound cardiac but more musculoskeletal.

## 2017-05-26 NOTE — Assessment & Plan Note (Addendum)
History of essential hypertension blood pressures measured to 134/74. He is on hydrochlorothiazide and amlodipine. Continue current meds at current dosing.

## 2017-05-26 NOTE — Patient Instructions (Signed)
Medication Instructions: Your physician recommends that you continue on your current medications as directed. Please refer to the Current Medication list given to you today.   Follow-Up: Your physician recommends that you schedule a follow-up appointment as needed with Dr. Berry.    

## 2017-05-26 NOTE — Assessment & Plan Note (Signed)
History of hyperlipidemia previously on statin drug which was recently discontinued.

## 2017-05-29 ENCOUNTER — Ambulatory Visit: Payer: No Typology Code available for payment source

## 2017-06-29 ENCOUNTER — Telehealth: Payer: Self-pay | Admitting: Internal Medicine

## 2017-06-29 DIAGNOSIS — I10 Essential (primary) hypertension: Secondary | ICD-10-CM

## 2017-06-29 MED ORDER — AMLODIPINE BESYLATE 5 MG PO TABS: 5.0000 mg | ORAL_TABLET | Freq: Every day | ORAL | 2 refills | Status: DC

## 2017-06-29 MED ORDER — HYDROCHLOROTHIAZIDE 25 MG PO TABS: 25.0000 mg | ORAL_TABLET | Freq: Every day | ORAL | 2 refills | Status: DC

## 2017-06-29 MED FILL — HYDROCHLOROTHIAZIDE 25 MG T: 25 | 30 days supply | Qty: 30 | Fill #0

## 2017-06-29 MED FILL — AMLODIPINE BESYLATE 5 MG TA: 5 | 30 days supply | Qty: 30 | Fill #0

## 2017-06-29 NOTE — Telephone Encounter (Signed)
Refilled

## 2017-06-29 NOTE — Telephone Encounter (Signed)
Pt called to request a refill for hydrochlorothiazide (HYDRODIURIL) 25 MG tablet [ amLODipine (NORVASC) 5 MG tablet Please [facility] it [facility]  [facility] [place] Please follow up
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
## 2017-08-06 MED FILL — AMLODIPINE BESYLATE 5 MG TA: 5 | 30 days supply | Qty: 30 | Fill #1

## 2017-08-06 MED FILL — HYDROCHLOROTHIAZIDE 25 MG T: 25 | 30 days supply | Qty: 30 | Fill #1

## 2017-08-10 ENCOUNTER — Encounter: Payer: Self-pay | Admitting: Internal Medicine

## 2017-08-10 ENCOUNTER — Ambulatory Visit: Payer: No Typology Code available for payment source | Attending: Internal Medicine | Admitting: Internal Medicine

## 2017-08-10 VITALS — BP 144/90 | HR 63 | Temp 99.6°F | Resp 16 | Wt 164.6 lb

## 2017-08-10 DIAGNOSIS — J302 Other seasonal allergic rhinitis: Secondary | ICD-10-CM | POA: Insufficient documentation

## 2017-08-10 DIAGNOSIS — Z8249 Family history of ischemic heart disease and other diseases of the circulatory system: Secondary | ICD-10-CM | POA: Insufficient documentation

## 2017-08-10 DIAGNOSIS — Z833 Family history of diabetes mellitus: Secondary | ICD-10-CM | POA: Insufficient documentation

## 2017-08-10 DIAGNOSIS — I10 Essential (primary) hypertension: Secondary | ICD-10-CM | POA: Insufficient documentation

## 2017-08-10 DIAGNOSIS — L309 Dermatitis, unspecified: Secondary | ICD-10-CM | POA: Insufficient documentation

## 2017-08-10 DIAGNOSIS — F17200 Nicotine dependence, unspecified, uncomplicated: Secondary | ICD-10-CM

## 2017-08-10 DIAGNOSIS — E785 Hyperlipidemia, unspecified: Secondary | ICD-10-CM | POA: Insufficient documentation

## 2017-08-10 DIAGNOSIS — Z79899 Other long term (current) drug therapy: Secondary | ICD-10-CM | POA: Insufficient documentation

## 2017-08-10 DIAGNOSIS — F17210 Nicotine dependence, cigarettes, uncomplicated: Secondary | ICD-10-CM | POA: Insufficient documentation

## 2017-08-10 MED ORDER — AMLODIPINE BESYLATE 10 MG PO TABS: 10.0000 mg | ORAL_TABLET | Freq: Every day | ORAL | 6 refills | Status: DC

## 2017-08-10 NOTE — Progress Notes (Signed)
Pt states he missed his dermatology appointment

## 2017-08-10 NOTE — Patient Instructions (Addendum)
Your blood pressure is not at goal.  Please increase Amlodipine (Norvasc)  to 10 mg daily. Continue to work on trying to quit smoking.
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
## 2017-09-14 ENCOUNTER — Ambulatory Visit: Payer: No Typology Code available for payment source

## 2017-09-21 MED FILL — HYDROCHLOROTHIAZIDE 25 MG T: 25 | 30 days supply | Qty: 30 | Fill #2

## 2017-09-21 MED FILL — AMLODIPINE BESYLATE 5 MG TA: 5 | 30 days supply | Qty: 30 | Fill #2

## 2017-10-02 ENCOUNTER — Ambulatory Visit: Payer: Self-pay | Attending: Family Medicine

## 2017-10-28 MED FILL — ?AMLODIPINE BESYLATE 5 MG T: 5 MG | 30 days supply | Qty: 30 | Fill #1

## 2017-10-28 MED FILL — HYDROCHLOROTHIAZIDE 25 MG T: 25 | 30 days supply | Qty: 30 | Fill #1

## 2017-11-10 ENCOUNTER — Ambulatory Visit: Payer: No Typology Code available for payment source | Admitting: Internal Medicine

## 2017-12-11 MED FILL — HYDROCHLOROTHIAZIDE 25 MG T: 25 | 30 days supply | Qty: 30 | Fill #2

## 2017-12-11 MED FILL — ?AMLODIPINE BESYLATE 5 MG T: 5 MG | 30 days supply | Qty: 30 | Fill #2

## 2017-12-18 ENCOUNTER — Ambulatory Visit: Payer: Self-pay | Attending: Internal Medicine | Admitting: Internal Medicine

## 2017-12-18 ENCOUNTER — Encounter: Payer: Self-pay | Admitting: Internal Medicine

## 2017-12-18 VITALS — BP 124/81 | HR 58 | Temp 98.4°F | Resp 16 | Wt 161.0 lb

## 2017-12-18 DIAGNOSIS — F1910 Other psychoactive substance abuse, uncomplicated: Secondary | ICD-10-CM | POA: Insufficient documentation

## 2017-12-18 DIAGNOSIS — N289 Disorder of kidney and ureter, unspecified: Secondary | ICD-10-CM

## 2017-12-18 DIAGNOSIS — L309 Dermatitis, unspecified: Secondary | ICD-10-CM

## 2017-12-18 DIAGNOSIS — F17200 Nicotine dependence, unspecified, uncomplicated: Secondary | ICD-10-CM

## 2017-12-18 DIAGNOSIS — E785 Hyperlipidemia, unspecified: Secondary | ICD-10-CM | POA: Insufficient documentation

## 2017-12-18 DIAGNOSIS — Z23 Encounter for immunization: Secondary | ICD-10-CM

## 2017-12-18 DIAGNOSIS — I10 Essential (primary) hypertension: Secondary | ICD-10-CM

## 2017-12-18 DIAGNOSIS — F17210 Nicotine dependence, cigarettes, uncomplicated: Secondary | ICD-10-CM | POA: Insufficient documentation

## 2017-12-18 DIAGNOSIS — R252 Cramp and spasm: Secondary | ICD-10-CM

## 2017-12-18 DIAGNOSIS — J309 Allergic rhinitis, unspecified: Secondary | ICD-10-CM | POA: Insufficient documentation

## 2017-12-18 NOTE — Patient Instructions (Signed)
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
## 2017-12-18 NOTE — Progress Notes (Signed)
Pt states yesterday he keep getting cramps. Pt states his right foot and his second to was cramping

## 2017-12-19 ENCOUNTER — Telehealth: Payer: Self-pay | Admitting: Internal Medicine

## 2017-12-19 DIAGNOSIS — N289 Disorder of kidney and ureter, unspecified: Secondary | ICD-10-CM

## 2017-12-19 LAB — COMPREHENSIVE METABOLIC PANEL
ALBUMIN: 4.5 g/dL (ref 3.5–5.5)
ALT: 14 IU/L (ref 0–44)
AST: 21 IU/L (ref 0–40)
Albumin/Globulin Ratio: 1.6 (ref 1.2–2.2)
Alkaline Phosphatase: 76 IU/L (ref 39–117)
BILIRUBIN TOTAL: 0.7 mg/dL (ref 0.0–1.2)
BUN / CREAT RATIO: 11 (ref 9–20)
BUN: 25 mg/dL — AB (ref 6–24)
CO2: 24 mmol/L (ref 20–29)
CREATININE: 2.21 mg/dL — AB (ref 0.76–1.27)
Calcium: 10 mg/dL (ref 8.7–10.2)
Chloride: 97 mmol/L (ref 96–106)
GFR calc non Af Amer: 36 mL/min/{1.73_m2} — ABNORMAL LOW (ref >59)
GFR, EST AFRICAN AMERICAN: 41 mL/min/{1.73_m2} — AB (ref >59)
GLOBULIN, TOTAL: 2.8 g/dL (ref 1.5–4.5)
GLUCOSE: 57 mg/dL — AB (ref 65–99)
Potassium: 4.1 mmol/L (ref 3.5–5.2)
SODIUM: 141 mmol/L (ref 134–144)
TOTAL PROTEIN: 7.3 g/dL (ref 6.0–8.5)

## 2017-12-19 LAB — CBC
HEMATOCRIT: 45.1 % (ref 37.5–51.0)
HEMOGLOBIN: 15.6 g/dL (ref 13.0–17.7)
MCH: 29.5 pg (ref 26.6–33.0)
MCHC: 34.6 g/dL (ref 31.5–35.7)
MCV: 85 fL (ref 79–97)
Platelets: 216 10*3/uL (ref 150–450)
RBC: 5.28 x10E6/uL (ref 4.14–5.80)
RDW: 13 % (ref 12.3–15.4)
WBC: 5.2 10*3/uL (ref 3.4–10.8)

## 2017-12-19 NOTE — Telephone Encounter (Signed)
Below is the message sent to my CMA to deliver to pt: Let patient know that his kidney function is not 100%; this is not new compared to one year ago.  I would avoid taking any over-the-counter pain medications like ibuprofen, Aleve, Advil or Naprosyn as these can make kidney function worse.  Tylenol is okay.  Please return to the lab to give a urine sample for further evaluation of kidney function.  We will also need to order a kidney ultrasound.  I have put in these orders.  We will also refer to a kidney specialist.

## 2017-12-28 ENCOUNTER — Telehealth: Payer: Self-pay | Admitting: Internal Medicine

## 2017-12-28 NOTE — Addendum Note (Signed)
Addended by: Paschal DoppWHITE, VANESSA J on: 12/28/2017 04:38 PM   Modules accepted: Orders

## 2017-12-29 LAB — URINALYSIS
Bilirubin, UA: NEGATIVE
GLUCOSE, UA: NEGATIVE
KETONES UA: NEGATIVE
LEUKOCYTES UA: NEGATIVE
Nitrite, UA: NEGATIVE
RBC UA: NEGATIVE
SPEC GRAV UA: 1.019 (ref 1.005–1.030)
Urobilinogen, Ur: 0.2 mg/dL (ref 0.2–1.0)
pH, UA: 5.5 (ref 5.0–7.5)

## 2017-12-29 LAB — MICROALBUMIN / CREATININE URINE RATIO
Creatinine, Urine: 151.6 mg/dL
Microalb/Creat Ratio: 1726.4 mg/g creat — ABNORMAL HIGH (ref 0.0–30.0)
Microalbumin, Urine: 2617.2 ug/mL

## 2017-12-30 ENCOUNTER — Other Ambulatory Visit: Payer: Self-pay | Admitting: Internal Medicine

## 2017-12-30 ENCOUNTER — Telehealth: Payer: Self-pay

## 2017-12-30 ENCOUNTER — Ambulatory Visit (HOSPITAL_COMMUNITY)
Admission: RE | Admit: 2017-12-30 | Discharge: 2017-12-30 | Disposition: A | Discharge status: Home / Self Care | Payer: Self-pay | Source: Ambulatory Visit | Attending: Internal Medicine | Admitting: Internal Medicine

## 2017-12-30 DIAGNOSIS — N289 Disorder of kidney and ureter, unspecified: Secondary | ICD-10-CM | POA: Insufficient documentation

## 2017-12-30 DIAGNOSIS — N400 Enlarged prostate without lower urinary tract symptoms: Secondary | ICD-10-CM | POA: Insufficient documentation

## 2017-12-30 MED ORDER — LISINOPRIL 5 MG PO TABS: 5.0000 mg | ORAL_TABLET | Freq: Every day | ORAL | 3 refills | Status: DC

## 2017-12-30 MED FILL — LISINOPRIL 5 MG TAB: 5 | 30 days supply | Qty: 30 | Fill #0

## 2017-12-30 NOTE — Telephone Encounter (Signed)
Contacted pt to go over urine results pt didn't answer and was unable to lvm  If pt calls back please give results: significant/concerning amount of protein in his urine. I would like to change one of his blood pressure medications to one called Lisinopril to help slow down this process. Tell him to stop the Hydrochlorothiazide. Start Lisinopril 5 mg instead. Rxn sent to pharmacy. Give him an appt with me sometime in October.

## 2017-12-31 NOTE — Telephone Encounter (Signed)
Patient called wanting to know his ultrasound results.  CMA inform that his results are not back and will his nurse will contact him once the results are in.  Patient was also inform to stop taking the Hydrochlorothiazide and start taking Lisinopril 5mg .  Patient was inform of lab results and will make his October appt. With PCP when he pick up his medication.

## 2018-01-01 ENCOUNTER — Telehealth: Payer: Self-pay

## 2018-01-01 ENCOUNTER — Telehealth: Payer: Self-pay | Admitting: Internal Medicine

## 2018-01-01 NOTE — Telephone Encounter (Signed)
Contacted pt to go over [place] results was unable to reach pt due to call cannot be completed at this time   If pt calls back please give results: Ultrasound reveals no kidney abnormality but mild prostate enlargement. Follow up with PCP if he has urinary symptoms

## 2018-01-07 ENCOUNTER — Encounter: Payer: Self-pay | Admitting: Internal Medicine

## 2018-01-07 ENCOUNTER — Ambulatory Visit: Payer: Self-pay | Attending: Internal Medicine | Admitting: Internal Medicine

## 2018-01-07 VITALS — BP 126/81 | HR 63 | Temp 98.3°F | Resp 16 | Ht 69.0 in | Wt 165.2 lb

## 2018-01-07 DIAGNOSIS — F17210 Nicotine dependence, cigarettes, uncomplicated: Secondary | ICD-10-CM | POA: Insufficient documentation

## 2018-01-07 DIAGNOSIS — I129 Hypertensive chronic kidney disease with stage 1 through stage 4 chronic kidney disease, or unspecified chronic kidney disease: Secondary | ICD-10-CM | POA: Insufficient documentation

## 2018-01-07 DIAGNOSIS — N183 Chronic kidney disease, stage 3 unspecified: Secondary | ICD-10-CM

## 2018-01-07 DIAGNOSIS — N400 Enlarged prostate without lower urinary tract symptoms: Secondary | ICD-10-CM

## 2018-01-07 DIAGNOSIS — R809 Proteinuria, unspecified: Secondary | ICD-10-CM
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
## 2018-01-07 NOTE — Progress Notes (Signed)
Patient is here to follow-up on blood pressure and prostate.

## 2018-01-08 ENCOUNTER — Telehealth: Payer: Self-pay

## 2018-01-08 LAB — BASIC METABOLIC PANEL
BUN / CREAT RATIO: 19 (ref 9–20)
BUN: 20 mg/dL (ref 6–24)
CALCIUM: 9.7 mg/dL (ref 8.7–10.2)
CO2: 24 mmol/L (ref 20–29)
Chloride: 107 mmol/L — ABNORMAL HIGH (ref 96–106)
Creatinine, Ser: 1.07 mg/dL (ref 0.76–1.27)
GFR calc Af Amer: 99 mL/min/{1.73_m2} (ref >59)
GFR, EST NON AFRICAN AMERICAN: 86 mL/min/{1.73_m2} (ref >59)
Glucose: 83 mg/dL (ref 65–99)
Potassium: 4.1 mmol/L (ref 3.5–5.2)
SODIUM: 144 mmol/L (ref 134–144)

## 2018-01-08 NOTE — Telephone Encounter (Signed)
CMA attempt to reach patient to inform on results and PCP advising.  His sister answered and stated she do not live with patient and she do not have his current number.  Unable to reach patient and will send a letter out to reach patient.  If patient call back, please inform:  Let patient know that his kidney function based on blood test done yesterday has significantly improved back to normal compared to the blood test done in September.  This is good!!!  I would like for him to return to the lab in about 1 month to give a urine sample for [place] to see whether the amount of protein in the urine has decreased with him being on the lisinopril.

## 2018-01-08 NOTE — Telephone Encounter (Signed)
-----   Message from Marcine Matar, MD sent at 01/08/2018  8:33 AM EDT ----- Let patient know that his kidney function based on blood test done yesterday has significantly improved back to normal compared to the blood test done in September.  This is good!!!  I would like for him to return to the lab in about 1 month to give a urine sample for [place] to see whether the amount of protein in the urine has decreased with him being on the lisinopril.

## 2018-01-25 ENCOUNTER — Other Ambulatory Visit: Payer: Self-pay | Admitting: Internal Medicine

## 2018-01-25 DIAGNOSIS — I10 Essential (primary) hypertension: Secondary | ICD-10-CM

## 2018-01-28 ENCOUNTER — Other Ambulatory Visit: Payer: Self-pay | Admitting: Internal Medicine

## 2018-01-28 DIAGNOSIS — I10 Essential (primary) hypertension: Secondary | ICD-10-CM

## 2018-01-29 ENCOUNTER — Other Ambulatory Visit: Payer: Self-pay | Admitting: Internal Medicine

## 2018-01-29 DIAGNOSIS — I10 Essential (primary) hypertension: Secondary | ICD-10-CM

## 2018-01-29 MED FILL — ?AMLODIPINE BESYLATE 5 MG T: 5 MG | 30 days supply | Qty: 30 | Fill #0

## 2018-02-12 MED FILL — LISINOPRIL 5 MG TABLET: 5 | 30 days supply | Qty: 30 | Fill #1

## 2018-02-26 ENCOUNTER — Encounter: Payer: Self-pay | Admitting: Internal Medicine

## 2018-03-11 ENCOUNTER — Ambulatory Visit (INDEPENDENT_AMBULATORY_CARE_PROVIDER_SITE_OTHER): Payer: Self-pay | Admitting: Family Medicine

## 2018-03-11 ENCOUNTER — Telehealth: Payer: Self-pay | Admitting: Family Medicine

## 2018-03-11 ENCOUNTER — Other Ambulatory Visit: Payer: Self-pay

## 2018-03-11 VITALS — BP 138/85 | HR 71 | Temp 98.1°F | Wt 170.0 lb

## 2018-03-11 DIAGNOSIS — L309 Dermatitis, unspecified: Secondary | ICD-10-CM

## 2018-03-11 DIAGNOSIS — L989 Disorder of the skin and subcutaneous tissue, unspecified: Secondary | ICD-10-CM

## 2018-03-11 LAB — POCT ORAL KOH: KOH PREP POC: NEGATIVE

## 2018-03-11 MED ORDER — CLOBETASOL PROPIONATE 0.05 % EX CREA
1.0000 | TOPICAL_CREAM | Freq: Two times a day (BID) | CUTANEOUS | 0 refills | Status: DC
Start: 2018-03-11 — End: 2018-05-10

## 2018-03-11 NOTE — Telephone Encounter (Signed)
KOH result discussed with him.

## 2018-03-11 NOTE — Assessment & Plan Note (Signed)
Chronic rash that appears unchanged since pictures taken about 1 year ago.  Unsure what the etiology of this rash is, but it does not appear to be an acute infection.  Constitutional symptoms are mostly negative and HIV was recently negative, so underlying illness is less likely.  Will do a skin scraping and test for KOH due to scaliness of 1 of the patches on his back.  We will also obtain a punch biopsy from 1 of the lesions on his back and 1 of the lesions from his chest to further characterize his rash.  Will withhold therapy since the etiology of his rash is currently unknown.

## 2018-03-11 NOTE — Patient Instructions (Addendum)
Today, we did three different biopsies, one to look for the presence of fungus, and the other 2 to look at your skin and subcutaneous tissue under a microscope.  We will call you with the results.   Skin Biopsy, Care After Refer to this sheet in the next few weeks. These instructions provide you with information about caring for yourself after your procedure. Your health care provider may also give you more specific instructions. Your treatment has been planned according to current medical practices, but problems sometimes occur. Call your health care provider if you have any problems or questions after your procedure. What can I expect after the procedure? After the procedure, it is common to have:  Soreness.  Bruising.  Itching.  Follow these instructions at home:  Rest and then return to your normal activities as told by your health care provider.  Take over-the-counter and prescription medicines only as told by your health care provider.  Follow instructions from your health care provider about how to take care of your biopsy site.Make sure you: ? Wash your hands with soap and water before you change your bandage (dressing). If soap and water are not available, use hand sanitizer. ? Change your dressing as told by your health care provider. ? Leave stitches (sutures), skin glue, or adhesive strips in place. These skin closures may need to stay in place for 2 weeks or longer. If adhesive strip edges start to loosen and curl up, you may trim the loose edges. Do not remove adhesive strips completely unless your health care provider tells you to do that. If the biopsy area bleeds, apply gentle pressure for 10 minutes.  Check your biopsy site every day for signs of infection. Check for: ? More redness, swelling, or pain. ? More fluid or blood. ? Warmth. ? Pus or a bad smell.  Keep all follow-up visits as told by your health care provider. This is important. Contact a health care  provider if:  You have more redness, swelling, or pain around your biopsy site.  You have more fluid or blood coming from your biopsy site.  Your biopsy site feels warm to the touch.  You have pus or a bad smell coming from your biopsy site.  You have a fever. Get help right away if:  You have bleeding that does not stop with pressure or a dressing. This information is not intended to replace advice given to you by your health care provider. Make sure you discuss any questions you have with your health care provider. Document Released: 04/20/2015 Document Revised: 11/18/2015 Document Reviewed: 06/21/2014 Elsevier Interactive Patient Education  [employer]2018 [employer] Inc[employer]
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
## 2018-03-11 NOTE — Progress Notes (Signed)
Patient ID: Marc Mata, male   DOB: [birth year], 42 y.o.   MRN: [id number] Atypical skin lesion. ??Mycosis fungoides. Skin biopsy completed after obtaining both verbal and written informed consent. Trial of high potency steroid recommended. KOH negative. We will contact him soon with is biopsy report.   I called and discussed his KOH result with him.

## 2018-03-12 MED FILL — CLOBETASOL PROPIONATE 0.05: 0.05 | 15 days supply | Qty: 30 | Fill #0

## 2018-03-17 ENCOUNTER — Telehealth: Payer: Self-pay | Admitting: Family Medicine

## 2018-03-17 NOTE — Telephone Encounter (Signed)
Thanks

## 2018-03-17 NOTE — Telephone Encounter (Signed)
Pt called back and lm on nurse line.  Attempted to call back but it went straight to VM. Fleeger, Maryjo RochesterJessica Dawn, CMA

## 2018-03-17 NOTE — Telephone Encounter (Signed)
HIPAA compliant call back message left.    Please inform patient when he calls: His skin biopsy result shows CHRONIC PERIFOLLICULITIS, which simply means chronic inflammation of skin surrounding his hair follicles.  On exam, no signs of infection or inflammation, hence antibiotic is not warranted at this time.  Treatment include high potency steroid which he is already on.  We will consider Dermatology specialist referral if there is poor response to Clobetasol/high potency steroid.

## 2018-03-17 NOTE — Telephone Encounter (Signed)
Pt called back.  Informed of message.  He has yet to pick up the cream.  He will go now to get. Mazey Mantell, Maryjo RochesterJessica Dawn, CMA

## 2018-04-08 MED FILL — LISINOPRIL 5 MG TAB: 5 | 30 days supply | Qty: 30 | Fill #2

## 2018-04-08 MED FILL — AMLODIPINE BESYLATE 5 MG TA: 5 | 30 days supply | Qty: 30 | Fill #1

## 2018-04-19 ENCOUNTER — Ambulatory Visit: Payer: Self-pay | Admitting: Internal Medicine

## 2018-04-20 ENCOUNTER — Ambulatory Visit: Payer: Self-pay | Attending: Internal Medicine | Admitting: Internal Medicine

## 2018-04-20 ENCOUNTER — Encounter: Payer: Self-pay | Admitting: Internal Medicine

## 2018-04-20 VITALS — BP 146/89 | HR 68 | Temp 98.4°F | Resp 16 | Ht 68.0 in | Wt 168.4 lb

## 2018-04-20 DIAGNOSIS — I10 Essential (primary) hypertension: Secondary | ICD-10-CM

## 2018-04-20 DIAGNOSIS — L739 Follicular disorder, unspecified: Secondary | ICD-10-CM

## 2018-04-20 DIAGNOSIS — F17200 Nicotine dependence, unspecified, uncomplicated: Secondary | ICD-10-CM

## 2018-04-20 DIAGNOSIS — F17210 Nicotine dependence, cigarettes, uncomplicated: Secondary | ICD-10-CM
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
## 2018-04-20 NOTE — Patient Instructions (Signed)
Please give appointment with clinical pharmacist in 1 month for repeat blood pressure check.

## 2018-05-07 ENCOUNTER — Ambulatory Visit: Payer: Self-pay

## 2018-05-10 ENCOUNTER — Other Ambulatory Visit: Payer: Self-pay | Admitting: Internal Medicine

## 2018-05-10 MED ORDER — CLOBETASOL PROPIONATE 0.05 % EX CREA
1.0000 | TOPICAL_CREAM | Freq: Two times a day (BID) | CUTANEOUS | 2 refills | Status: DC
Start: 2018-05-10 — End: 2019-01-17

## 2018-05-10 MED FILL — CLOBETASOL PROPIONATE 0.05: 0.05 | 15 days supply | Qty: 30 | Fill #0

## 2018-05-10 NOTE — Telephone Encounter (Signed)
Pt called in stating he needs a refill on  Clobetasol cream  At [facility] please follow up

## 2018-05-27 MED FILL — CLOBETASOL PROPIONATE 0.05: 0.05 | 15 days supply | Qty: 30 | Fill #0

## 2018-06-14 ENCOUNTER — Emergency Department (HOSPITAL_COMMUNITY): Payer: Self-pay

## 2018-06-14 ENCOUNTER — Encounter (HOSPITAL_COMMUNITY): Payer: Self-pay | Admitting: Emergency Medicine

## 2018-06-14 ENCOUNTER — Other Ambulatory Visit: Payer: Self-pay

## 2018-06-14 ENCOUNTER — Emergency Department (HOSPITAL_COMMUNITY)
Admission: EM | Admit: 2018-06-14 | Discharge: 2018-06-14 | Discharge status: Against Medical Advice | Payer: Self-pay | Attending: Emergency Medicine | Admitting: Emergency Medicine

## 2018-06-14 DIAGNOSIS — Z79899 Other long term (current) drug therapy: Secondary | ICD-10-CM | POA: Insufficient documentation

## 2018-06-14 DIAGNOSIS — R079 Chest pain, unspecified: Secondary | ICD-10-CM | POA: Insufficient documentation

## 2018-06-14 DIAGNOSIS — R1084 Generalized abdominal pain: Secondary | ICD-10-CM | POA: Insufficient documentation

## 2018-06-14 DIAGNOSIS — R55 Syncope and collapse: Secondary | ICD-10-CM | POA: Insufficient documentation

## 2018-06-14 DIAGNOSIS — I10 Essential (primary) hypertension: Secondary | ICD-10-CM | POA: Insufficient documentation

## 2018-06-14 DIAGNOSIS — F17210 Nicotine dependence, cigarettes, uncomplicated: Secondary | ICD-10-CM | POA: Insufficient documentation

## 2018-06-14 LAB — I-STAT TROPONIN, ED: Troponin i, poc: 0.01 ng/mL (ref 0.00–0.08)

## 2018-06-14 LAB — CBC WITH DIFFERENTIAL/PLATELET
Abs Immature Granulocytes: 0.04 10*3/uL (ref 0.00–0.07)
BASOS ABS: 0 10*3/uL (ref 0.0–0.1)
Basophils Relative: 0 %
EOS ABS: 0.1 10*3/uL (ref 0.0–0.5)
Eosinophils Relative: 0 %
HEMATOCRIT: 45.2 % (ref 39.0–52.0)
Hemoglobin: 15.3 g/dL (ref 13.0–17.0)
IMMATURE GRANULOCYTES: 0 %
Lymphocytes Relative: 17 %
Lymphs Abs: 1.9 10*3/uL (ref 0.7–4.0)
MCH: 29.5 pg (ref 26.0–34.0)
MCHC: 33.8 g/dL (ref 30.0–36.0)
MCV: 87.3 fL (ref 80.0–100.0)
MONO ABS: 1 10*3/uL (ref 0.1–1.0)
MONOS PCT: 8 %
NEUTROS PCT: 75 %
Neutro Abs: 8.7 10*3/uL — ABNORMAL HIGH (ref 1.7–7.7)
PLATELETS: 229 10*3/uL (ref 150–400)
RBC: 5.18 MIL/uL (ref 4.22–5.81)
RDW: 13 % (ref 11.5–15.5)
WBC: 11.7 10*3/uL — AB (ref 4.0–10.5)
nRBC: 0 % (ref 0.0–0.2)

## 2018-06-14 LAB — BASIC METABOLIC PANEL
ANION GAP: 8 (ref 5–15)
BUN: 11 mg/dL (ref 6–20)
CALCIUM: 9.4 mg/dL (ref 8.9–10.3)
CO2: 26 mmol/L (ref 22–32)
Chloride: 104 mmol/L (ref 98–111)
Creatinine, Ser: 1.07 mg/dL (ref 0.61–1.24)
GFR calc non Af Amer: >60 mL/min (ref >60)
Glucose, Bld: 149 mg/dL — ABNORMAL HIGH (ref 70–99)
Potassium: 3.3 mmol/L — ABNORMAL LOW (ref 3.5–5.1)
Sodium: 138 mmol/L (ref 135–145)

## 2018-06-14 LAB — ETHANOL: Alcohol, Ethyl (B): <10 mg/dL (ref <10)

## 2018-06-14 LAB — LIPASE, BLOOD: LIPASE: 39 U/L (ref 11–51)

## 2018-06-14 NOTE — ED Notes (Signed)
Pt refused to have vitals taken 

## 2018-06-14 NOTE — ED Triage Notes (Signed)
Pt  Refuses blood work draws. Pa informed that Pt refuses EKG- blod draw.

## 2018-06-14 NOTE — ED Triage Notes (Signed)
PT refused EKG ,PA informed.

## 2018-06-14 NOTE — ED Triage Notes (Signed)
Pt now swearing and cussing at staff  When asked if he wanted his blood work done. Pt pointed to EKG  Machine and reported he did that . Pt reported can I just cancel everything.
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
## 2018-06-14 NOTE — ED Notes (Addendum)
Pt allowed this tech to obtain EKG. While this tech was placing leads, Pt stated "the person before you was very rude, she's lucky... she's lucky." Pt couldn't tell this tech who was rude or why. RN and PA informed of Pt's threatening language.
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
## 2018-06-14 NOTE — ED Triage Notes (Signed)
Pt went to [facility] but did not collect urine sample.
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
## 2018-06-14 NOTE — ED Triage Notes (Signed)
Pt now has agreed to have blood drawn

## 2018-06-14 NOTE — ED Triage Notes (Signed)
Pt is refusing an EKG  As ordered for the reported CP. Pt reported to NT that he was not going to do any that was going to give him a big bill.

## 2018-07-02 ENCOUNTER — Other Ambulatory Visit: Payer: Self-pay | Admitting: Internal Medicine

## 2018-07-02 DIAGNOSIS — I10 Essential (primary) hypertension: Secondary | ICD-10-CM

## 2018-07-05 MED FILL — AMLODIPINE BESYLATE 5 MG TA: 5 | 30 days supply | Qty: 30 | Fill #2

## 2018-07-20 ENCOUNTER — Ambulatory Visit: Payer: Self-pay | Admitting: Internal Medicine

## 2018-07-22 ENCOUNTER — Ambulatory Visit (HOSPITAL_COMMUNITY)
Admission: EM | Admit: 2018-07-22 | Discharge: 2018-07-22 | Disposition: A | Discharge status: Home / Self Care | Payer: Self-pay | Attending: Internal Medicine | Admitting: Internal Medicine

## 2018-07-22 ENCOUNTER — Other Ambulatory Visit: Payer: Self-pay

## 2018-07-22 ENCOUNTER — Encounter (HOSPITAL_COMMUNITY): Payer: Self-pay

## 2018-07-22 DIAGNOSIS — Z7689 Persons encountering health services in other specified circumstances: Secondary | ICD-10-CM

## 2018-07-22 DIAGNOSIS — Z7721 Contact with and (suspected) exposure to potentially hazardous body fluids: Secondary | ICD-10-CM

## 2018-07-22 NOTE — ED Provider Notes (Addendum)
[facility]   [id number] 07/22/18 Arrival Time: 1655  CC: Work note  SUBJECTIVE:  Marc Mata is a 43 y.o. male hx significant for HTN, who presents for return to work after exposure to fecal matter earlier today. 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  Patient states he was wearing gloves, and noticed areas of fecal matter on his gloves, the sleeve of his shirt and pants.  Denies fecal exposure to eyes, nose, mouth, or open cuts or wounds.  Denies fever, chills, nausea, vomiting, erythema, swelling, discharge, SOB, chest pain, abdominal pain, changes in bowel or bladder function.    ROS: As per HPI.  Past Medical History:  Diagnosis Date  . Hypertension Dx 2007   Past Surgical History:  Procedure Laterality Date  . APPENDECTOMY  1996   No Known Allergies No current facility-administered medications on file prior to encounter.    Current Outpatient Medications on File Prior to Encounter  Medication Sig Dispense Refill  . amLODipine (NORVASC) 5 MG tablet TAKE 1 TABLET BY MOUTH DAILY. (Patient taking differently: Take 5 mg by mouth daily. ) 30 tablet 2  . clobetasol cream (TEMOVATE) 0.05 % Apply 1 application topically 2 (two) times daily. 30 g 2  . lisinopril (PRINIVIL,ZESTRIL) 5 MG tablet Take 1 tablet (5 mg total) by mouth daily. 90 tablet 3   Social History   Socioeconomic History  . Marital status: Single    Spouse name: Not on file  . Number of children: Not on file  . Years of education: Not on file  . Highest education level: Not on file  Occupational History  . Not on file  Social Needs  . Financial resource strain: Not on file  . Food insecurity:    Worry: Not on file    Inability: Not on file  . Transportation needs:    Medical: Not on file    Non-medical: Not on file  Tobacco Use  . Smoking status: Current Every Day Smoker    Packs/day:  0.25    Types: Cigarettes  . Smokeless tobacco: Never Used  Substance and Sexual Activity  . Alcohol use: Yes    Alcohol/week: 0.0 standard drinks    Comment: ocassionally  . Drug use: Yes    Types: Codeine, Marijuana, Cocaine    Comment: Cocaine-last used 02/17/2015 Marijuana  "this morning" 03/20/2015  . Sexual activity: Yes  Lifestyle  . Physical activity:    Days per week: Not on file    Minutes per session: Not on file  . Stress: Not on file  Relationships  . Social connections:    Talks on phone: Not on file    Gets together: Not on file    Attends religious service: Not on file    Active member of club or organization: Not on file    Attends meetings of clubs or organizations: Not on file    Relationship status: Not on file  . Intimate partner violence:    Fear of current or ex partner: Not on file    Emotionally abused: Not on file    Physically abused: Not on file    Forced sexual activity: Not on file  Other Topics Concern  . Not on file  Social History Narrative  . Not on file   Family History  Problem Relation Age of Onset  . Hypertension Mother   .  Heart disease Mother   . Cancer Father   . Hypertension Brother   . Diabetes Sister     OBJECTIVE: Vitals:   07/22/18 1728  BP: (!) 147/79  Pulse: 85  Resp: 17  Temp: 98.8 F (37.1 C)  TempSrc: Oral  SpO2: 100%    General appearance: alert; no distress Head: NCAT Lungs: clear to auscultation bilaterally Heart: regular rate and rhythm.  Radial pulse 2+ bilaterally Abdomen: soft, nondistended, normal active bowel sounds; nontender to palpation; no guarding  Extremities: no edema Skin: warm and dry Psychological: alert and cooperative; normal mood and affect  ASSESSMENT & PLAN:  1. Return to work evaluation   2. Exposure to blood or body fluid   Discussed with Dr. Leonides GrillsLamptey.  No concern for exposure.     No concern for exposure today Safe to return to work tomorrow Please return or go to the  ED if you have any symptoms such as fever, chills, nausea, vomiting, changes in bowel or bladder habits, abdominal discomfort, etc...  Reviewed expectations re: course of current medical issues. Questions answered. Outlined signs and symptoms indicating need for more acute intervention. Patient verbalized understanding. After Visit Summary given.      Rennis HardingWurst, Safa Derner, PA-C 07/22/18 [phone]

## 2018-07-22 NOTE — Discharge Instructions (Addendum)
No concern for exposure today Safe to return to work tomorrow Please return or go to the ED if you have any symptoms such as fever, chills, nausea, vomiting, changes in bowel or bladder habits, abdominal discomfort, etc..Marland Kitchen

## 2018-07-22 NOTE — ED Triage Notes (Signed)
Patient presents to Urgent Care with complaints of getting feces on him while at work since this morning. Patient states he needs a note to return to work.

## 2018-07-22 NOTE — ED Notes (Signed)
Patient verbalizes understanding of discharge instructions. Opportunity for questioning and answers were provided. Patient discharged from [facility] by RN.  

## 2018-08-24 MED FILL — CLOBETASOL PROPIONATE 0.05: 0.05 | 15 days supply | Qty: 30 | Fill #1

## 2018-10-06 ENCOUNTER — Other Ambulatory Visit: Payer: Self-pay

## 2018-10-06 ENCOUNTER — Ambulatory Visit: Payer: Self-pay | Attending: Internal Medicine

## 2018-10-27 ENCOUNTER — Other Ambulatory Visit: Payer: Self-pay | Admitting: Internal Medicine

## 2018-10-27 DIAGNOSIS — I10 Essential (primary) hypertension: Secondary | ICD-10-CM

## 2018-10-27 MED FILL — LISINOPRIL 5 MG TAB: 5 | 30 days supply | Qty: 30 | Fill #3

## 2018-11-22 ENCOUNTER — Ambulatory Visit: Payer: Self-pay | Admitting: Internal Medicine

## 2018-12-06 ENCOUNTER — Other Ambulatory Visit: Payer: Self-pay

## 2018-12-06 ENCOUNTER — Ambulatory Visit: Payer: Self-pay | Attending: Internal Medicine | Admitting: Internal Medicine

## 2018-12-06 ENCOUNTER — Other Ambulatory Visit: Payer: Self-pay | Admitting: Internal Medicine

## 2018-12-06 DIAGNOSIS — I10 Essential (primary) hypertension: Secondary | ICD-10-CM

## 2018-12-06 MED ORDER — AMLODIPINE BESYLATE 5 MG PO TABS: 5.0000 mg | ORAL_TABLET | Freq: Every day | ORAL | 1 refills | Status: DC

## 2018-12-06 MED ORDER — LISINOPRIL 5 MG PO TABS: 5.0000 mg | ORAL_TABLET | Freq: Every day | ORAL | 1 refills | Status: DC

## 2018-12-06 NOTE — Progress Notes (Signed)
Pt is requesting a med refill for a pill that was prescribed for his skin

## 2018-12-06 NOTE — Progress Notes (Signed)
Patient was scheduled for telephone visit.  He did speak with my CMA.  When I called him back at 3:14 PM, I got his voicemail.  I left a message stating who I am  and that I was calling to do his telephone encounter.  I would try to call him back later.  I did call back and patient again did not answer.  I will leave that up to him to call to reschedule this appointment.

## 2018-12-07 MED FILL — ?AMLODIPINE BESYLATE 5MG TA: 5 | 30 days supply | Qty: 30 | Fill #0

## 2018-12-07 MED FILL — LISINOPRIL 5 MG TAB: 5 | 30 days supply | Qty: 30 | Fill #0

## 2018-12-15 ENCOUNTER — Ambulatory Visit: Payer: Self-pay | Attending: Family Medicine | Admitting: Physician Assistant

## 2018-12-15 ENCOUNTER — Other Ambulatory Visit: Payer: Self-pay

## 2018-12-15 DIAGNOSIS — I10 Essential (primary) hypertension: Secondary | ICD-10-CM

## 2018-12-15 DIAGNOSIS — L739 Follicular disorder, unspecified: Secondary | ICD-10-CM

## 2018-12-15 MED ORDER — LISINOPRIL 5 MG PO TABS: 5.0000 mg | ORAL_TABLET | Freq: Every day | ORAL | 3 refills | Status: DC

## 2018-12-15 MED ORDER — FLUCONAZOLE 150 MG PO TABS: ORAL_TABLET | ORAL | 0 refills | Status: DC

## 2018-12-15 MED ORDER — AMLODIPINE BESYLATE 5 MG PO TABS: 5.0000 mg | ORAL_TABLET | Freq: Every day | ORAL | 3 refills | Status: DC

## 2018-12-15 MED FILL — FLUCONAZOLE 150 MG TABLET: 150 | 28 days supply | Qty: 4 | Fill #0

## 2018-12-15 NOTE — Progress Notes (Signed)
Virtual Visit via Telephone Note  I connected with Marc Mata on 12/15/18 at  9:10 AM EDT by telephone and verified that I am speaking with the correct person using two identifiers.   I discussed the limitations, risks, security and privacy concerns of performing an evaluation and management service by telephone and the availability of in person appointments. I also discussed with the patient that there may be a patient responsible charge related to this service. The patient expressed understanding and agreed to proceed.  Patient location:  home My Location:  [place] office Persons on the call:  Me and the patient   History of Present Illness:  Needs meds for BP and also for a rash.  Says he took diflucan for a month previously and this got rid of the rash on his neck.  It started coming back a few weeks ago.  He has been feeling good.  No CP/HA/Dizziness.    Observations/Objective:  A&Ox3   Assessment and Plan: 1. Essential hypertension We have discussed target BP range and blood pressure goal. I have advised patient to check BP regularly and to call us back or report to clinic if the numbers are consistently higher than 140/90. We discussed the importance of compliance with medical therapy and DASH diet recommended, consequences of uncontrolled hypertension discussed.  - amLODipine (NORVASC) 5 MG tablet; Take 1 tablet (5 mg total) by mouth daily.  Dispense: 30 tablet; Refill: 3 - lisinopril (ZESTRIL) 5 MG tablet; Take 1 tablet (5 mg total) by mouth daily.  Dispense: 30 tablet; Refill: 3  2. Chronic folliculitis Per patient request - fluconazole (DIFLUCAN) 150 MG tablet; Take 1 tablet weekly for 1 month  Dispense: 4 tablet; Refill: 0    Follow Up Instructions: See PCP in 4-6 weeks   I discussed the assessment and treatment plan with the patient. The patient was provided an opportunity to ask questions and all were answered. The patient agreed with the plan and demonstrated an  understanding of the instructions.   The patient was advised to call back or seek an in-person evaluation if the symptoms worsen or if the condition fails to improve as anticipated.  I provided 7 minutes of non-face-to-face time during this encounter.   Freeman Caldron, PA-C  Patient ID: Marc Mata, male   DOB: 1975/05/23, 43 y.o.   MRN: [id number]

## 2018-12-17 MED FILL — ?AMLODIPINE BESYLATE 5MG TA: 5 | 30 days supply | Qty: 30 | Fill #0

## 2018-12-17 MED FILL — LISINOPRIL 5 MG TABLET: 5 | 30 days supply | Qty: 30 | Fill #0

## 2019-01-17 ENCOUNTER — Other Ambulatory Visit: Payer: Self-pay | Admitting: Family Medicine

## 2019-01-18 MED FILL — CLOBETASOL PROPIONATE 0.05: 0.05 | 15 days supply | Qty: 30 | Fill #0

## 2019-05-06 ENCOUNTER — Ambulatory Visit: Payer: Self-pay | Admitting: Internal Medicine

## 2019-05-20 ENCOUNTER — Ambulatory Visit: Payer: Self-pay

## 2019-07-14 ENCOUNTER — Other Ambulatory Visit: Payer: Self-pay | Admitting: Internal Medicine

## 2019-07-14 MED FILL — AMLODIPINE BESYLATE 5 MG TA: 5 | 30 days supply | Qty: 30 | Fill #1

## 2019-07-14 MED FILL — LISINOPRIL 5 MG TABLET: 5 | 30 days supply | Qty: 30 | Fill #1

## 2019-08-10 ENCOUNTER — Encounter (HOSPITAL_COMMUNITY): Payer: Self-pay | Admitting: Emergency Medicine

## 2019-08-10 ENCOUNTER — Telehealth: Payer: Self-pay

## 2019-08-10 ENCOUNTER — Emergency Department (HOSPITAL_COMMUNITY)
Admission: EM | Admit: 2019-08-10 | Discharge: 2019-08-10 | Disposition: A | Discharge status: Home / Self Care | Payer: Self-pay | Attending: Emergency Medicine | Admitting: Emergency Medicine

## 2019-08-10 ENCOUNTER — Other Ambulatory Visit: Payer: Self-pay

## 2019-08-10 DIAGNOSIS — F1210 Cannabis abuse, uncomplicated: Secondary | ICD-10-CM | POA: Insufficient documentation

## 2019-08-10 DIAGNOSIS — Z79899 Other long term (current) drug therapy: Secondary | ICD-10-CM | POA: Insufficient documentation

## 2019-08-10 DIAGNOSIS — F17210 Nicotine dependence, cigarettes, uncomplicated: Secondary | ICD-10-CM | POA: Insufficient documentation

## 2019-08-10 DIAGNOSIS — N342 Other urethritis: Secondary | ICD-10-CM | POA: Insufficient documentation

## 2019-08-10 DIAGNOSIS — I10 Essential (primary) hypertension: Secondary | ICD-10-CM | POA: Insufficient documentation

## 2019-08-10 DIAGNOSIS — R319 Hematuria, unspecified: Secondary | ICD-10-CM | POA: Insufficient documentation

## 2019-08-10 DIAGNOSIS — F1410 Cocaine abuse, uncomplicated: Secondary | ICD-10-CM | POA: Insufficient documentation

## 2019-08-10 DIAGNOSIS — A599 Trichomoniasis, unspecified: Secondary | ICD-10-CM | POA: Insufficient documentation

## 2019-08-10 DIAGNOSIS — F1110 Opioid abuse, uncomplicated: Secondary | ICD-10-CM | POA: Insufficient documentation

## 2019-08-10 LAB — URINALYSIS, ROUTINE W REFLEX MICROSCOPIC
Bilirubin Urine: NEGATIVE
Glucose, UA: NEGATIVE mg/dL
Hgb urine dipstick: NEGATIVE
Ketones, ur: NEGATIVE mg/dL
Nitrite: NEGATIVE
Protein, ur: >=300 mg/dL — AB
Specific Gravity, Urine: 1.017 (ref 1.005–1.030)
pH: 6 (ref 5.0–8.0)

## 2019-08-10 LAB — I-STAT CREATININE, ED: Creatinine, Ser: 1.3 mg/dL — ABNORMAL HIGH (ref 0.61–1.24)

## 2019-08-10 LAB — HIV ANTIBODY (ROUTINE TESTING W REFLEX): HIV Screen 4th Generation wRfx: NONREACTIVE

## 2019-08-10 MED ORDER — DOXYCYCLINE HYCLATE 100 MG PO CAPS: 100.0000 mg | ORAL_CAPSULE | Freq: Two times a day (BID) | ORAL | 0 refills | Status: AC

## 2019-08-10 MED ORDER — AMLODIPINE BESYLATE 10 MG PO TABS: 10.0000 mg | ORAL_TABLET | Freq: Every day | ORAL | 0 refills | Status: DC

## 2019-08-10 MED ORDER — LIDOCAINE HCL (PF) 1 % IJ SOLN
INTRAMUSCULAR | Status: AC
  Filled 2019-08-10: qty 5

## 2019-08-10 MED ORDER — METRONIDAZOLE 500 MG PO TABS
2000.0000 mg | ORAL_TABLET | Freq: Once | ORAL | Status: AC
  Administered 2019-08-10: 18:00:00 2000 mg via ORAL
  Filled 2019-08-10: qty 4

## 2019-08-10 MED ORDER — CEFTRIAXONE SODIUM 500 MG IJ SOLR
500.0000 mg | Freq: Once | INTRAMUSCULAR | Status: AC
  Administered 2019-08-10: 18:00:00 500 mg via INTRAMUSCULAR
  Filled 2019-08-10: qty 500

## 2019-08-10 MED ORDER — AMLODIPINE BESYLATE 5 MG PO TABS
10.0000 mg | ORAL_TABLET | Freq: Once | ORAL | Status: AC
  Administered 2019-08-10: 18:00:00 10 mg via ORAL
  Filled 2019-08-10: qty 2
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
## 2019-08-10 NOTE — Discharge Instructions (Addendum)
Please take all of your antibiotics until finished!   Take your antibiotics with food.  Common side effects of antibiotics include nausea, vomiting, abdominal discomfort, and diarrhea. You may help offset some of this with probiotics which you can buy or get in yogurt. Do not eat  or take the probiotics until 2 hours after your antibiotic.    Follow up with [facility] STD clinic to be screened for HIV in the future and for future STD concerns or screenings. This is the recommendation by the CDC for people with multiple sexual partners or hx of STDs. You are being treated for gonorrhea and chlamydia in the ER but the hospital will call you if lab is positive.  Do not have sexual intercourse for 7 days after antibiotic treatment.  Please let your sexual partner(s) know that you tested positive for an STD and that they should seek testing and treatment at the health department as well.  Take your blood pressure medications as prescribed.  Continue to avoid salty foods and exercise at least 3 times weekly.  Please follow-up with the PCP at [facility] for reevaluation and medication management.  This will be extremely important to avoid long-term health issues related to your blood pressure.  Return to the emergency department if any concerning signs or symptoms develop such as abdominal pain, vomiting, fevers, pain with bowel movements, headaches, chest pain, shortness of breath, or change in urination.

## 2019-08-10 NOTE — Telephone Encounter (Signed)
Patient called and he states that he need to speak with his nurse or PCP, patient did not want to state the problem.

## 2019-08-10 NOTE — ED Triage Notes (Signed)
Patient arrives ambulatory c/o weak urine flow and blood from penis. Patient c/o dysuria x 3 days. Reports recent unprotected sex but does not think this is a STD. Patient also has not taken BP medication in over 6 months due to trying to treat with limiting salt intake.

## 2019-08-10 NOTE — ED Notes (Signed)
Pt discharged in NAD, discharge instruction understanding verbalized

## 2019-08-10 NOTE — Telephone Encounter (Signed)
Returned pt call. A lady picked up the phone and stated I have the wrong number and hung up

## 2019-08-11 LAB — GC/CHLAMYDIA PROBE AMP (~~LOC~~) NOT AT ARMC
Chlamydia: NEGATIVE
Comment: NEGATIVE
Comment: NORMAL
Neisseria Gonorrhea: NEGATIVE

## 2019-08-11 LAB — RPR: RPR Ser Ql: NONREACTIVE

## 2019-08-11 MED FILL — ?DOXYCYCLINE HYCLATE 100MG: 100 | 7 days supply | Qty: 14 | Fill #0

## 2019-08-11 MED FILL — AMLODIPINE BESYLATE 10 MG T: 10 | 30 days supply | Qty: 30 | Fill #0

## 2019-08-12 MED FILL — LISINOPRIL 5 MG TABLET: 5 | 30 days supply | Qty: 30 | Fill #1

## 2019-08-12 MED FILL — ?AMLODIPINE BESYLATE 5MG TA: 5 | 30 days supply | Qty: 30 | Fill #1

## 2019-08-21 ENCOUNTER — Telehealth: Payer: Self-pay

## 2019-08-21 NOTE — Telephone Encounter (Signed)
Patient called in to ask about his medicines when he came in on the 5th of May. He stated he picked up 3 prescriptions that day, and didn't really look at the bottles until today the bottles revealed two amlodipine prescriptions one for 5mg  and one for 10 mg, he has been taking both. He has been feeling dizzy having some chest pain on and off and feeling very tired. We went over the medicines that he had listed on his medication reconciliation sheet with the new ones added. To clarify the medications he is supposed to be taking. I asked him if the bottles have the same doctors name on them, he looked and stated no. Initially he spoke about litigious words and fault,at the end of the conversation he was more  concerned about symptoms that he was having. I encouraged him to take his blood pressure, and told him if he felt he needed to come in to get checked we are here for him. 

## 2019-08-23 ENCOUNTER — Other Ambulatory Visit: Payer: Self-pay | Admitting: Internal Medicine

## 2019-08-23 ENCOUNTER — Encounter: Payer: Self-pay | Admitting: Internal Medicine

## 2019-08-23 ENCOUNTER — Ambulatory Visit: Payer: Self-pay | Attending: Internal Medicine | Admitting: Internal Medicine

## 2019-08-23 ENCOUNTER — Other Ambulatory Visit: Payer: Self-pay

## 2019-08-23 VITALS — BP 118/73 | HR 98 | Temp 98.1°F | Resp 16 | Wt 170.8 lb

## 2019-08-23 DIAGNOSIS — Z113 Encounter for screening for infections with a predominantly sexual mode of transmission: Secondary | ICD-10-CM

## 2019-08-23 DIAGNOSIS — I10 Essential (primary) hypertension: Secondary | ICD-10-CM

## 2019-08-23 DIAGNOSIS — R351 Nocturia: Secondary | ICD-10-CM

## 2019-08-23 DIAGNOSIS — B360 Pityriasis versicolor: Secondary | ICD-10-CM

## 2019-08-23 DIAGNOSIS — R809 Proteinuria, unspecified: Secondary | ICD-10-CM

## 2019-08-23 MED ORDER — AMLODIPINE BESYLATE 10 MG PO TABS: 10.0000 mg | ORAL_TABLET | Freq: Every day | ORAL | 6 refills | Status: DC

## 2019-08-23 MED ORDER — TAMSULOSIN HCL 0.4 MG PO CAPS: 0.4000 mg | ORAL_CAPSULE | Freq: Every day | ORAL | 0 refills | Status: DC

## 2019-08-23 MED ORDER — AMLODIPINE BESYLATE 10 MG PO TABS: 10.0000 mg | ORAL_TABLET | Freq: Every day | ORAL | 0 refills | Status: DC

## 2019-08-23 MED ORDER — KETOCONAZOLE 2 % EX CREA: TOPICAL_CREAM | CUTANEOUS | 0 refills | Status: DC

## 2019-08-23 MED ORDER — LISINOPRIL 5 MG PO TABS: 5.0000 mg | ORAL_TABLET | Freq: Every day | ORAL | 6 refills | Status: DC

## 2019-08-23 MED FILL — KETOCONAZOLE 2% CREAM: 2 | 14 days supply | Qty: 15 | Fill #0

## 2019-08-23 MED FILL — TAMSULOSIN HCL 0.4 MG CAP: 0.4 | 30 days supply | Qty: 30 | Fill #0

## 2019-08-23 NOTE — Patient Instructions (Signed)
I have prescribed some ketoconazole cream for you to use on the affected areas of your skin.  Continue amlodipine 10 mg daily and lisinopril 5 mg daily.  I have prescribed a medication called Flomax to use daily to see whether it will help with improving her urine flow.
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
## 2019-08-24 ENCOUNTER — Encounter: Payer: Self-pay | Admitting: Internal Medicine

## 2019-08-24 DIAGNOSIS — R809 Proteinuria, unspecified: Secondary | ICD-10-CM | POA: Insufficient documentation

## 2019-08-24 LAB — HEMOGLOBIN A1C
Est. average glucose Bld gHb Est-mCnc: 114 mg/dL
Hgb A1c MFr Bld: 5.6 % (ref 4.8–5.6)

## 2019-08-24 LAB — COMPREHENSIVE METABOLIC PANEL
ALT: 19 IU/L (ref 0–44)
AST: 20 IU/L (ref 0–40)
Albumin/Globulin Ratio: 1 — ABNORMAL LOW (ref 1.2–2.2)
Albumin: 3.6 g/dL — ABNORMAL LOW (ref 4.0–5.0)
Alkaline Phosphatase: 83 IU/L (ref 48–121)
BUN/Creatinine Ratio: 21 — ABNORMAL HIGH (ref 9–20)
BUN: 33 mg/dL — ABNORMAL HIGH (ref 6–24)
Bilirubin Total: <0.2 mg/dL (ref 0.0–1.2)
CO2: 26 mmol/L (ref 20–29)
Calcium: 9.1 mg/dL (ref 8.7–10.2)
Chloride: 103 mmol/L (ref 96–106)
Creatinine, Ser: 1.6 mg/dL — ABNORMAL HIGH (ref 0.76–1.27)
GFR calc Af Amer: 60 mL/min/{1.73_m2} (ref >59)
GFR calc non Af Amer: 52 mL/min/{1.73_m2} — ABNORMAL LOW (ref >59)
Globulin, Total: 3.5 g/dL (ref 1.5–4.5)
Glucose: 60 mg/dL — ABNORMAL LOW (ref 65–99)
Potassium: 4.7 mmol/L (ref 3.5–5.2)
Sodium: 142 mmol/L (ref 134–144)
Total Protein: 7.1 g/dL (ref 6.0–8.5)

## 2019-08-24 LAB — URINALYSIS, ROUTINE W REFLEX MICROSCOPIC
Bilirubin, UA: NEGATIVE
Glucose, UA: NEGATIVE
Ketones, UA: NEGATIVE
Leukocytes,UA: NEGATIVE
Nitrite, UA: NEGATIVE
RBC, UA: NEGATIVE
Specific Gravity, UA: 1.02 (ref 1.005–1.030)
Urobilinogen, Ur: 0.2 mg/dL (ref 0.2–1.0)
pH, UA: 5.5 (ref 5.0–7.5)

## 2019-08-24 LAB — LIPID PANEL
Chol/HDL Ratio: 5.9 ratio — ABNORMAL HIGH (ref 0.0–5.0)
Cholesterol, Total: 170 mg/dL (ref 100–199)
HDL: 29 mg/dL — ABNORMAL LOW (ref >39)
LDL Chol Calc (NIH): 66 mg/dL (ref 0–99)
Triglycerides: 485 mg/dL — ABNORMAL HIGH (ref 0–149)
VLDL Cholesterol Cal: 75 mg/dL — ABNORMAL HIGH (ref 5–40)

## 2019-08-24 LAB — URINE CYTOLOGY ANCILLARY ONLY
Chlamydia: NEGATIVE
Comment: NEGATIVE
Comment: NEGATIVE
Comment: NORMAL
Neisseria Gonorrhea: NEGATIVE
Trichomonas: NEGATIVE

## 2019-08-24 LAB — MICROSCOPIC EXAMINATION
Bacteria, UA: NONE SEEN
Casts: NONE SEEN /lpf

## 2019-08-24 LAB — MICROALBUMIN / CREATININE URINE RATIO
Creatinine, Urine: 212.8 mg/dL
Microalb/Creat Ratio: 516 mg/g creat — ABNORMAL HIGH (ref 0–29)
Microalbumin, Urine: 1098.7 ug/mL

## 2019-08-24 NOTE — Progress Notes (Signed)
Please let patient know that his kidney function is not 100%.  He should avoid taking over-the-counter medications like Aleve Advil, ibuprofen, Naprosyn.  Please make sure that he drinks 4 to 8 glasses of water daily to keep himself hydrated.  He does have some protein in the urine again but much less compared to what it was a year and a half ago.  Total and LDL cholesterol are normal.  Triglyceride level elevated.  Healthy eating habits and regular exercise will help to lower triglycerides.  Screening test for diabetes normal.

## 2019-08-27 ENCOUNTER — Telehealth: Payer: Self-pay

## 2019-08-27 NOTE — Telephone Encounter (Signed)
Contacted pt to go over lab results pt is aware and doesn't have any questions or concerns 

## 2019-09-22 MED FILL — LISINOPRIL 5 MG TABLET: 5 | 30 days supply | Qty: 30 | Fill #0

## 2019-09-22 MED FILL — AMLODIPINE BESYLATE 5 MG TA: 5 | 30 days supply | Qty: 30 | Fill #0

## 2019-11-11 MED FILL — AMLODIPINE BESYLATE 5 MG TA: 5 | 30 days supply | Qty: 30 | Fill #1

## 2019-11-11 MED FILL — LISINOPRIL 5 MG TABLET: 5 | 30 days supply | Qty: 30 | Fill #1

## 2019-11-25 ENCOUNTER — Ambulatory Visit: Payer: Self-pay | Attending: Internal Medicine

## 2019-11-25 ENCOUNTER — Other Ambulatory Visit: Payer: Self-pay

## 2019-11-30 ENCOUNTER — Telehealth: Payer: Self-pay | Admitting: Internal Medicine

## 2019-11-30 NOTE — Telephone Encounter (Signed)
Pt was inform that I did not got any email about him and his dateline is 12/05/19, Pt understood

## 2019-11-30 NOTE — Telephone Encounter (Signed)
Copied from CRM [id number]. Topic: General - Other >> Nov 29, 2019  3:08 PM Wyonia Hough E wrote: Reason for CRM: Pt called to speak with Carlos/ Pt has some paperwork that he needs to give to [facility] and wanted to know the deadline he was given/ please advise asap / Pt will drop off today if needed

## 2019-12-01 ENCOUNTER — Telehealth: Payer: Self-pay | Admitting: Internal Medicine

## 2019-12-01 NOTE — Telephone Encounter (Signed)
Please follow up Copied from CRM [id number]. Topic: General - Call Back - No Documentation >> Nov 30, 2019  2:40 PM Randol Kern wrote: Reason for CRM: Pt called requesting to speak to Marc Mata, says he just missed call. Tried calling office, no answer

## 2019-12-01 NOTE — Telephone Encounter (Signed)
Spoke with the Pt already ?

## 2019-12-06 ENCOUNTER — Telehealth: Payer: Self-pay | Admitting: Internal Medicine

## 2019-12-06 NOTE — Telephone Encounter (Signed)
Pt was sent a letter from financial dept. Inform them, that the application they submitted was incomplete, since they were missing some documentation at the time of the appointment, Pt need to reschedule and resubmit all new papers and application for [employer] and [place], P.S. old documents has been sent back by mail to the Pt and Pt. need to make a new appt. 

## 2019-12-06 NOTE — Telephone Encounter (Signed)
Pt is calling to let carlos know the family member sign paperwork before the paperwork was notarized and he would like to come pick up another form

## 2019-12-15 ENCOUNTER — Ambulatory Visit: Payer: Self-pay

## 2019-12-15 ENCOUNTER — Other Ambulatory Visit: Payer: Self-pay

## 2019-12-26 ENCOUNTER — Ambulatory Visit: Payer: Self-pay | Attending: Internal Medicine | Admitting: Internal Medicine

## 2019-12-26 DIAGNOSIS — Z23 Encounter for immunization: Secondary | ICD-10-CM

## 2019-12-26 DIAGNOSIS — I10 Essential (primary) hypertension: Secondary | ICD-10-CM

## 2019-12-26 DIAGNOSIS — R809 Proteinuria, unspecified: Secondary | ICD-10-CM

## 2019-12-26 NOTE — Progress Notes (Signed)
Pt states when he coughs he has a headache on the top of his head

## 2019-12-26 NOTE — Progress Notes (Signed)
Virtual Visit via Telephone Note Due to current restrictions/limitations of in-office visits due to the COVID-19 pandemic, this scheduled clinical appointment was converted to a telehealth visit  I connected with Marc Mata on 12/26/19 at 4:12 p.m by telephone and verified that I am speaking with the correct person using two identifiers. I am in my office.  The patient is at home.  Only the patient and myself participated in this encounter.  I discussed the limitations, risks, security and privacy concerns of performing an evaluation and management service by telephone and the availability of in person appointments. I also discussed with the patient that there may be a patient responsible charge related to this service. The patient expressed understanding and agreed to proceed.   History of Present Illness: Patient with history of HTN, macroalbuminuria, tobacco dependence, allergic rhinitis, and history of polysubstance abuse.  Last eval 08/2019.  Purpose of today's visit is chronic disease management.  HM: took [employer] 1st shot sometimes last wk and had a headache. Plans to get flu shot also.  HTN: reports compliance with Norvasc and Lisinopril. Limits salt in foods.  Has a home BP device but it is not working.  No CP/SOB. On blood test done on last visit, he has microalbumin urea with level being 516 decreased from 1700 over a year ago.  Outpatient Encounter Medications as of 12/26/2019  Medication Sig  . amLODipine (NORVASC) 10 MG tablet Take 1 tablet (10 mg total) by mouth daily.  . clobetasol cream (TEMOVATE) 0.05 % APPLY 1 APPLICATION TOPICALLY 2 (TWO) TIMES DAILY. (Patient not taking: Reported on 08/23/2019)  . ketoconazole (NIZORAL) 2 % cream Apply twice daily to affected area x2 weeks  . lisinopril (ZESTRIL) 5 MG tablet Take 1 tablet (5 mg total) by mouth daily.  . tamsulosin (FLOMAX) 0.4 MG CAPS capsule Take 1 capsule (0.4 mg total) by mouth daily.   No facility-administered encounter  medications on file as of 12/26/2019.      Observations/Objective: Results for orders placed or performed in visit on 08/23/19  Microscopic Examination  Result Value Ref Range   WBC, UA 0-5 0 - 5 /hpf   RBC 0-2 0 - 2 /hpf   Epithelial Cells (non renal) 0-10 0 - 10 /hpf   Casts None seen None seen /lpf   Bacteria, UA None seen None seen/Few  Comprehensive metabolic panel  Result Value Ref Range   Glucose 60 (L) 65 - 99 mg/dL   BUN 33 (H) 6 - 24 mg/dL   Creatinine, Ser 7.82 (H) 0.76 - 1.27 mg/dL   GFR calc non Af Amer 52 (L) >59 mL/min/1.73   GFR calc Af Amer 60 >59 mL/min/1.73   BUN/Creatinine Ratio 21 (H) 9 - 20   Sodium 142 134 - 144 mmol/L   Potassium 4.7 3.5 - 5.2 mmol/L   Chloride 103 96 - 106 mmol/L   CO2 26 20 - 29 mmol/L   Calcium 9.1 8.7 - 10.2 mg/dL   Total Protein 7.1 6.0 - 8.5 g/dL   Albumin 3.6 (L) 4.0 - 5.0 g/dL   Globulin, Total 3.5 1.5 - 4.5 g/dL   Albumin/Globulin Ratio 1.0 (L) 1.2 - 2.2   Bilirubin Total <0.2 0.0 - 1.2 mg/dL   Alkaline Phosphatase 83 48 - 121 IU/L   AST 20 0 - 40 IU/L   ALT 19 0 - 44 IU/L  Lipid panel  Result Value Ref Range   Cholesterol, Total 170 100 - 199 mg/dL   Triglycerides 956 (H)  0 - 149 mg/dL   HDL 29 (L) >52 mg/dL   VLDL Cholesterol Cal 75 (H) 5 - 40 mg/dL   LDL Chol Calc (NIH) 66 0 - 99 mg/dL   Chol/HDL Ratio 5.9 (H) 0.0 - 5.0 ratio  Hemoglobin A1c  Result Value Ref Range   Hgb A1c MFr Bld 5.6 4.8 - 5.6 %   Est. average glucose Bld gHb Est-mCnc 114 mg/dL  Microalbumin / creatinine urine ratio  Result Value Ref Range   Creatinine, Urine 212.8 Not Estab. mg/dL   Microalbumin, Urine 7,782.4 Not Estab. ug/mL   Microalb/Creat Ratio 516 (H) 0 - 29 mg/g creat  Urinalysis, Routine w reflex microscopic  Result Value Ref Range   Specific Gravity, UA 1.020 1.005 - 1.030   pH, UA 5.5 5.0 - 7.5   Color, UA Yellow Yellow   Appearance Ur Clear Clear   Leukocytes,UA Negative Negative   Protein,UA 3+ (A) Negative/Trace   Glucose,  UA Negative Negative   Ketones, UA Negative Negative   RBC, UA Negative Negative   Bilirubin, UA Negative Negative   Urobilinogen, Ur 0.2 0.2 - 1.0 mg/dL   Nitrite, UA Negative Negative   Microscopic Examination See below:   Urine cytology ancillary only  Result Value Ref Range   Neisseria Gonorrhea Negative    Chlamydia Negative    Trichomonas Negative    Comment Normal Reference Range Trichomonas - Negative    Comment Normal Reference Ranger Chlamydia - Negative    Comment      Normal Reference Range Neisseria Gonorrhea - Negative     Assessment and Plan: 1. Essential hypertension Continue lisinopril and Norvasc.  He can come to see the pharmacist in 2 weeks for blood pressure check.  He will stop at the lab at that time to have blood test for chemistry and recheck urine microalbumin to see whether it has improved on the lisinopril.  2. Positive for macroalbuminuria See plan and #1  3.  Need for influenza -Plans to get his flu shot after he has completed the COVID-19 vaccine series.  Follow Up Instructions: 2 wks for BP check with clinical pharmacist.  Stop at lab on that visit for recheck BMP and urine microalbumin  f/u with me in 4 mths  I discussed the assessment and treatment plan with the patient. The patient was provided an opportunity to ask questions and all were answered. The patient agreed with the plan and demonstrated an understanding of the instructions.   The patient was advised to call back or seek an in-person evaluation if the symptoms worsen or if the condition fails to improve as anticipated.  I provided 5 minutes of non-face-to-face time during this encounter.   Jonah Blue, MD

## 2020-01-03 ENCOUNTER — Encounter (HOSPITAL_COMMUNITY): Payer: Self-pay | Admitting: Pediatrics

## 2020-01-03 ENCOUNTER — Ambulatory Visit: Payer: Self-pay | Admitting: *Deleted

## 2020-01-03 ENCOUNTER — Emergency Department (HOSPITAL_COMMUNITY)
Admission: EM | Admit: 2020-01-03 | Discharge: 2020-01-03 | Disposition: A | Discharge status: Home / Self Care | Payer: Self-pay | Attending: Emergency Medicine | Admitting: Emergency Medicine

## 2020-01-03 ENCOUNTER — Other Ambulatory Visit: Payer: Self-pay

## 2020-01-03 DIAGNOSIS — H1031 Unspecified acute conjunctivitis, right eye: Secondary | ICD-10-CM | POA: Insufficient documentation

## 2020-01-03 DIAGNOSIS — I10 Essential (primary) hypertension: Secondary | ICD-10-CM | POA: Insufficient documentation

## 2020-01-03 DIAGNOSIS — T1591XA Foreign body on external eye, part unspecified, right eye, initial encounter: Secondary | ICD-10-CM | POA: Insufficient documentation

## 2020-01-03 DIAGNOSIS — F17210 Nicotine dependence, cigarettes, uncomplicated: Secondary | ICD-10-CM | POA: Insufficient documentation

## 2020-01-03 DIAGNOSIS — Z79899 Other long term (current) drug therapy: Secondary | ICD-10-CM | POA: Insufficient documentation

## 2020-01-03 DIAGNOSIS — X58XXXA Exposure to other specified factors, initial encounter: Secondary | ICD-10-CM | POA: Insufficient documentation

## 2020-01-03 MED ORDER — FLUORESCEIN SODIUM 1 MG OP STRP
1.0000 | ORAL_STRIP | Freq: Once | OPHTHALMIC | Status: AC
  Administered 2020-01-03: 1 via OPHTHALMIC
  Filled 2020-01-03: qty 1

## 2020-01-03 MED ORDER — TOBRAMYCIN 0.3 % OP SOLN
2.0000 [drp] | OPHTHALMIC | Status: DC
  Administered 2020-01-03: 2 [drp] via OPHTHALMIC
  Filled 2020-01-03: qty 5

## 2020-01-03 NOTE — ED Notes (Signed)
AVS reviewed with pt who verbalized understanding. Pt ambulatory out of dept. °

## 2020-01-03 NOTE — Telephone Encounter (Signed)
Will forward to covering provider.

## 2020-01-03 NOTE — Telephone Encounter (Signed)
Per initial encounter, "Patient has a small wood chip in right eye and would like recommendations. Please advise"; contacted pt regarding his symptoms; he states aood chip got into his right eye on 12/30/19; he was wearing glasses at that time of the incident; the pt states a wood chip blew off the floor and went under his glasses;; his right eye is sensitive to light and is blood shot;he rates his pain at 2 out 10; his pain is constant; recommendations made per nurse triage protocol; he verbalized understanding; the pt is seen at [facility]; will route to office for notification of encouner  Reason for Disposition  [1] Eye has been washed out > 30 minutes ago AND [2] feels like FB is still present  Answer Assessment - Initial Assessment Questions 1. TYPE OF FOREIGN BODY: "What got in the eye?"      Wood chip in right eye 2. ONSET: "When did it happen?"    12/30/19 3. MECHANISM: "How did it happen?"       Wood chip blew into eye 4. VISION: "Do you have blurred vision?"      No pt wears glasses 5. PAIN: "Is it painful?" If Yes, ask: "How bad is the pain?"  (Scale 1-10; or mild, moderate, severe)     2 out of 10 6. CONTACTS: "Do you wear contacts?"     no 7. OTHER SYMPTOMS: "Do you have any other symptoms?"    ? headache 8. PREGNANCY: "Is there any chance you are pregnant?" "When was your last menstrual period?"     n/a  Protocols used: EYE - FOREIGN BODY-A-AH

## 2020-01-03 NOTE — Telephone Encounter (Signed)
He needs an urgent care visit.

## 2020-01-03 NOTE — Discharge Instructions (Addendum)
Return if any problems.

## 2020-01-03 NOTE — ED Triage Notes (Signed)
Reported injured right eye last Friday; and saw dust remains in affected eye,  redness noted as well.

## 2020-01-03 NOTE — Telephone Encounter (Signed)
Please contact pt and schedule a urgent care visit with available provider
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
## 2020-01-06 ENCOUNTER — Ambulatory Visit: Payer: Self-pay | Admitting: *Deleted

## 2020-01-06 NOTE — Telephone Encounter (Signed)
C/o continued right eye pain and watering. Headache and nasal drainage reported. Denies fever, drainage of pus from eye. Pain comes and goes, does not keep awake at night. Denies visual disturbances other than sensitivity to light.  Using eye drops from recent ED visit with little relief. appt scheduled for 01/12/20. Care advise given. Patient verbalized understanding of care advise and to call back or go to [facility] or ED if symptoms worsen.   Reason for Disposition . Eye pain present > 24 hours  Answer Assessment - Initial Assessment Questions 1. ONSET: "When did the pain start?" (e.g., minutes, hours, days)    Continues with pain since going to ED 2. TIMING: "Does the pain come and go, or has it been constant since it started?" (e.g., constant, intermittent, fleeting)     Comes and goes 3. SEVERITY: "How bad is the pain?"   (Scale 1-10; mild, moderate or severe)   - MILD (1-3): doesn't interfere with normal activities    - MODERATE (4-7): interferes with normal activities or awakens from sleep    - SEVERE (8-10): excruciating pain and patient unable to do normal activities     Mild , sensitive to light 4. LOCATION: "Where does it hurt?"  (e.g., eyelid, eye, cheekbone)     In the eye, tearing and sensitive to light  5. CAUSE: "What do you think is causing the pain?"     Not sure  6. VISION: "Do you have blurred vision or changes in your vision?"      No can see out of eye  7. EYE DISCHARGE: "Is there any discharge (pus) from the eye(s)?"  If yes, ask: "What color is it?"      No discharge. 8. FEVER: "Do you have a fever?" If Yes, ask: "What is it, how was it measured, and when did it start?"      no 9. OTHER SYMPTOMS: "Do you have any other symptoms?" (e.g., headache, nasal discharge, facial rash)      Slight headache, and nasal drainage  Protocols used: EYE PAIN-A-AH

## 2020-01-06 NOTE — Telephone Encounter (Signed)
Noted. Matters will be discussed at upcoming visit.

## 2020-01-06 NOTE — Telephone Encounter (Signed)
Summary: Clinical Advice   Patient was seen in the ED on 01/03/2020 for Acute conjunctivitis of right eye and states symptoms have not improved. Patient states eye is red, watery, irritated, no dizziness, no other symptoms. Patient states droplets were prescribed. Patient is scheduled for follow up on 01/12/2020.      Attempted to call patient- left message to call back.

## 2020-01-10 ENCOUNTER — Ambulatory Visit: Payer: Self-pay | Admitting: Pharmacist

## 2020-01-11 MED FILL — AMLODIPINE BESYLATE 10 MG T: 10 | 30 days supply | Qty: 30 | Fill #0

## 2020-01-11 MED FILL — LISINOPRIL 5 MG TABLET: 5 | 30 days supply | Qty: 30 | Fill #2

## 2020-01-12 ENCOUNTER — Ambulatory Visit: Payer: Self-pay | Admitting: Physician Assistant

## 2020-01-30 ENCOUNTER — Telehealth: Payer: Self-pay | Admitting: Internal Medicine

## 2020-01-30 NOTE — Telephone Encounter (Signed)
Copied from CRM [id number]. Topic: General - Other >> Jan 30, 2020  9:50 AM Marc Mata A wrote: Reason for CRM: Patient called to speak to Dr Laural Benes about BP medication that he saw on the news that has cancer causing agents. Asking for a call back to discuss please Ph# [phone]

## 2020-01-30 NOTE — Telephone Encounter (Signed)
Could you please contact pt  

## 2020-01-31 NOTE — Telephone Encounter (Signed)
ATC pt for follow-up, no answer, LM to RC  

## 2020-02-01 NOTE — Telephone Encounter (Signed)
Spoke w/ pt and he stated that he spoke w/ the call center/triage nurse when he 1st called and she told him that he was not on the medication that he had concerns about and answered his questions, pt no longer with concerns

## 2020-03-20 ENCOUNTER — Other Ambulatory Visit: Payer: Self-pay | Admitting: Internal Medicine

## 2020-03-20 DIAGNOSIS — I10 Essential (primary) hypertension: Secondary | ICD-10-CM

## 2020-03-20 MED ORDER — AMLODIPINE BESYLATE 10 MG PO TABS: 10.0000 mg | ORAL_TABLET | Freq: Every day | ORAL | 1 refills | Status: DC

## 2020-03-20 MED ORDER — LISINOPRIL 5 MG PO TABS: 5.0000 mg | ORAL_TABLET | Freq: Every day | ORAL | 1 refills | Status: DC

## 2020-03-20 MED FILL — AMLODIPINE BESYLATE 10 MG T: 10 | 30 days supply | Qty: 30 | Fill #0

## 2020-03-20 MED FILL — LISINOPRIL 5 MG TABLET: 5 | 30 days supply | Qty: 30 | Fill #0

## 2020-03-20 NOTE — Telephone Encounter (Signed)
Medication Refill - Medication: lisinopril (ZESTRIL) 5 MG tablet ,amLODipine (NORVASC) 10 MG tablet    Has the patient contacted their pharmacy?yes (Agent: If no, request that the patient contact the pharmacy for the refill.) (Agent: If yes, when and what did the pharmacy advise?)Contact PCP  Preferred Pharmacy (with phone number or street name):  [facility] [place], [place] - [address] Phone:  [phone]  Fax:  [phone]       Agent: Please be advised that RX refills may take up to 3 business days. We ask that you follow-up with your pharmacy.

## 2020-03-20 NOTE — Telephone Encounter (Signed)
Requested Prescriptions  Pending Prescriptions Disp Refills  . lisinopril (ZESTRIL) 5 MG tablet 90 tablet 1    Sig: Take 1 tablet (5 mg total) by mouth daily.     Cardiovascular:  ACE Inhibitors Failed - 03/20/2020 10:29 AM      Failed - Cr in normal range and within 180 days    Creat  Date Value Ref Range Status  02/05/2016 1.01 0.60 - 1.35 mg/dL Final   Creatinine, Ser  Date Value Ref Range Status  08/23/2019 1.60 (H) 0.76 - 1.27 mg/dL Final         Failed - K in normal range and within 180 days    Potassium  Date Value Ref Range Status  08/23/2019 4.7 3.5 - 5.2 mmol/L Final         Failed - Last BP in normal range    BP Readings from Last 1 Encounters:  01/03/20 (!) 152/104         Passed - Patient is not pregnant      Passed - Valid encounter within last 6 months    Recent Outpatient Visits          2 months ago Essential hypertension   [facility] Marcine Matar, MD   7 months ago Essential hypertension   [place] [facility] Marcine Matar, MD   1 year ago Chronic folliculitis   [facility] [place], Marzella Schlein, [place]   1 year ago Essential hypertension   [place] [facility] Marcine Matar, MD   1 year ago Essential hypertension   [place] [facility] Marcine Matar, MD      Future Appointments            In 1 month Laural Benes, Binnie Rail, MD [facility]           . amLODipine (NORVASC) 10 MG tablet 90 tablet 1    Sig: Take 1 tablet (10 mg total) by mouth daily.     Cardiovascular:  Calcium Channel Blockers Failed - 03/20/2020 10:29 AM      Failed - Last BP in normal range    BP Readings from Last 1 Encounters:  01/03/20 (!) 152/104         Passed - Valid encounter within last 6 months    Recent Outpatient Visits          2 months ago Essential hypertension   [facility] Marcine Matar, MD   7 months ago Essential hypertension   [place] [facility] Marcine Matar, MD   1 year ago Chronic folliculitis   [facility] [place], Marzella Schlein, [place]   1 year ago Essential hypertension   [place] [facility] Marcine Matar, MD   1 year ago Essential hypertension    [facility] Marcine Matar, MD      Future Appointments            In 1 month Laural Benes Binnie Rail, MD [facility]

## 2020-04-26 ENCOUNTER — Ambulatory Visit: Payer: Self-pay | Admitting: Internal Medicine

## 2021-08-05 ENCOUNTER — Ambulatory Visit (HOSPITAL_COMMUNITY)
Admission: EM | Admit: 2021-08-05 | Discharge: 2021-08-05 | Discharge status: Against Medical Advice | Payer: Self-pay | Attending: Family Medicine | Admitting: Family Medicine

## 2021-08-05 ENCOUNTER — Ambulatory Visit
Admission: EM | Admit: 2021-08-05 | Discharge: 2021-08-05 | Disposition: A | Discharge status: Home / Self Care | Payer: Self-pay

## 2021-08-05 ENCOUNTER — Ambulatory Visit (INDEPENDENT_AMBULATORY_CARE_PROVIDER_SITE_OTHER): Payer: Self-pay

## 2021-08-05 ENCOUNTER — Ambulatory Visit (HOSPITAL_COMMUNITY)
Admission: EM | Admit: 2021-08-05 | Discharge: 2021-08-05 | Disposition: A | Discharge status: Home / Self Care | Payer: Self-pay | Attending: Family Medicine | Admitting: Family Medicine

## 2021-08-05 ENCOUNTER — Encounter (HOSPITAL_COMMUNITY): Payer: Self-pay

## 2021-08-05 DIAGNOSIS — M25512 Pain in left shoulder: Secondary | ICD-10-CM

## 2021-08-05 DIAGNOSIS — M542 Cervicalgia: Secondary | ICD-10-CM

## 2021-08-05 DIAGNOSIS — S60311A Abrasion of right thumb, initial encounter: Secondary | ICD-10-CM

## 2021-08-05 MED ORDER — KETOROLAC TROMETHAMINE 30 MG/ML IJ SOLN: 30.0000 mg | Freq: Once | INTRAMUSCULAR | Status: DC

## 2021-08-05 MED ORDER — IBUPROFEN 800 MG PO TABS
800.0000 mg | ORAL_TABLET | Freq: Three times a day (TID) | ORAL | 0 refills | Status: DC | PRN
  Filled 2021-08-05: qty 21, 7d supply, fill #0

## 2021-08-05 NOTE — Discharge Instructions (Addendum)
Take ibuprofen 800 mg--1 tab every 8 hours as needed for pain. Or extra strength tylenol ? ?Go see your primary office about this pain and your blood pressure. ? ?Your xrays showed some mild arthritis changes on your neck and your shoulder. ? ? ?
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
## 2021-08-05 NOTE — ED Notes (Signed)
No answer from lobby  

## 2021-08-05 NOTE — ED Notes (Signed)
Called from lobby; no answer 

## 2021-08-05 NOTE — ED Triage Notes (Addendum)
Pt states he was cutting wood last week when a possible splinter pinched his right thumb. Pt states he is able to move it around.  ?Pt also states upper back pain towards neck region. Pt states it feels more like a stain since he has been working out. ?Pt states he does not take BP med, has tried to eat better and work out instead. ?

## 2021-08-05 NOTE — ED Triage Notes (Signed)
Pt called from front lobby ,no answer 

## 2021-08-06 ENCOUNTER — Other Ambulatory Visit: Payer: Self-pay

## 2021-08-13 ENCOUNTER — Other Ambulatory Visit: Payer: Self-pay

## 2021-10-03 ENCOUNTER — Other Ambulatory Visit: Payer: Self-pay

## 2021-10-03 ENCOUNTER — Encounter (HOSPITAL_COMMUNITY): Payer: Self-pay

## 2021-10-03 ENCOUNTER — Ambulatory Visit (HOSPITAL_COMMUNITY)
Admission: EM | Admit: 2021-10-03 | Discharge: 2021-10-03 | Disposition: A | Discharge status: Home / Self Care | Payer: Self-pay | Attending: Physician Assistant | Admitting: Physician Assistant

## 2021-10-03 DIAGNOSIS — I10 Essential (primary) hypertension: Secondary | ICD-10-CM

## 2021-10-03 MED ORDER — LISINOPRIL 5 MG PO TABS
5.0000 mg | ORAL_TABLET | Freq: Every day | ORAL | 0 refills | Status: DC
  Filled 2021-10-03: qty 90, 90d supply, fill #0

## 2021-10-03 MED ORDER — AMLODIPINE BESYLATE 10 MG PO TABS
10.0000 mg | ORAL_TABLET | Freq: Every day | ORAL | 0 refills | Status: DC
  Filled 2021-10-03: qty 90, 90d supply, fill #0

## 2021-10-03 NOTE — Discharge Instructions (Addendum)
Return if any problems.
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
## 2021-11-04 ENCOUNTER — Other Ambulatory Visit: Payer: Self-pay

## 2022-07-21 ENCOUNTER — Other Ambulatory Visit (HOSPITAL_COMMUNITY): Payer: Self-pay

## 2022-07-21 ENCOUNTER — Other Ambulatory Visit: Payer: Self-pay

## 2022-07-21 ENCOUNTER — Encounter (HOSPITAL_COMMUNITY): Payer: Self-pay

## 2022-07-21 ENCOUNTER — Emergency Department (HOSPITAL_COMMUNITY): Payer: 59

## 2022-07-21 ENCOUNTER — Ambulatory Visit (HOSPITAL_COMMUNITY)
Admission: EM | Admit: 2022-07-21 | Discharge: 2022-07-21 | Disposition: A | Discharge status: Home / Self Care | Payer: 59 | Attending: Nurse Practitioner | Admitting: Nurse Practitioner

## 2022-07-21 ENCOUNTER — Emergency Department (HOSPITAL_COMMUNITY)
Admission: EM | Admit: 2022-07-21 | Discharge: 2022-07-21 | Disposition: A | Discharge status: Home / Self Care | Payer: 59 | Attending: Emergency Medicine | Admitting: Emergency Medicine

## 2022-07-21 DIAGNOSIS — I10 Essential (primary) hypertension: Secondary | ICD-10-CM

## 2022-07-21 DIAGNOSIS — R9431 Abnormal electrocardiogram [ECG] [EKG]: Secondary | ICD-10-CM

## 2022-07-21 DIAGNOSIS — I159 Secondary hypertension, unspecified: Secondary | ICD-10-CM | POA: Diagnosis not present

## 2022-07-21 DIAGNOSIS — J45909 Unspecified asthma, uncomplicated: Secondary | ICD-10-CM | POA: Insufficient documentation

## 2022-07-21 DIAGNOSIS — M79674 Pain in right toe(s): Secondary | ICD-10-CM | POA: Insufficient documentation

## 2022-07-21 DIAGNOSIS — I161 Hypertensive emergency: Secondary | ICD-10-CM | POA: Diagnosis not present

## 2022-07-21 DIAGNOSIS — M25512 Pain in left shoulder: Secondary | ICD-10-CM | POA: Diagnosis not present

## 2022-07-21 DIAGNOSIS — R944 Abnormal results of kidney function studies: Secondary | ICD-10-CM | POA: Diagnosis not present

## 2022-07-21 DIAGNOSIS — Z20822 Contact with and (suspected) exposure to covid-19: Secondary | ICD-10-CM | POA: Diagnosis not present

## 2022-07-21 DIAGNOSIS — F17210 Nicotine dependence, cigarettes, uncomplicated: Secondary | ICD-10-CM | POA: Insufficient documentation

## 2022-07-21 DIAGNOSIS — Z79899 Other long term (current) drug therapy: Secondary | ICD-10-CM | POA: Insufficient documentation

## 2022-07-21 LAB — CBC
HCT: 48 % (ref 39.0–52.0)
Hemoglobin: 16.9 g/dL (ref 13.0–17.0)
MCH: 30.3 pg (ref 26.0–34.0)
MCHC: 35.2 g/dL (ref 30.0–36.0)
MCV: 86 fL (ref 80.0–100.0)
Platelets: 168 10*3/uL (ref 150–400)
RBC: 5.58 MIL/uL (ref 4.22–5.81)
RDW: 12.8 % (ref 11.5–15.5)
WBC: 7.6 10*3/uL (ref 4.0–10.5)
nRBC: 0 % (ref 0.0–0.2)

## 2022-07-21 LAB — BASIC METABOLIC PANEL
Anion gap: 11 (ref 5–15)
BUN: 17 mg/dL (ref 6–20)
CO2: 23 mmol/L (ref 22–32)
Calcium: 9.1 mg/dL (ref 8.9–10.3)
Chloride: 103 mmol/L (ref 98–111)
Creatinine, Ser: 1.55 mg/dL — ABNORMAL HIGH (ref 0.61–1.24)
GFR, Estimated: 56 mL/min — ABNORMAL LOW (ref >60)
Glucose, Bld: 108 mg/dL — ABNORMAL HIGH (ref 70–99)
Potassium: 4 mmol/L (ref 3.5–5.1)
Sodium: 137 mmol/L (ref 135–145)

## 2022-07-21 LAB — TROPONIN I (HIGH SENSITIVITY)
Troponin I (High Sensitivity): 8 ng/L (ref <18)
Troponin I (High Sensitivity): 8 ng/L (ref <18)

## 2022-07-21 LAB — SARS CORONAVIRUS 2 BY RT PCR: SARS Coronavirus 2 by RT PCR: NEGATIVE

## 2022-07-21 MED ORDER — ALBUTEROL SULFATE HFA 108 (90 BASE) MCG/ACT IN AERS
1.0000 | INHALATION_SPRAY | Freq: Once | RESPIRATORY_TRACT | Status: AC
  Administered 2022-07-21: 1 via RESPIRATORY_TRACT
  Filled 2022-07-21: qty 6.7

## 2022-07-21 MED ORDER — AMLODIPINE BESYLATE 5 MG PO TABS
10.0000 mg | ORAL_TABLET | Freq: Once | ORAL | Status: AC
Start: 2022-07-21 — End: 2022-07-21
  Administered 2022-07-21: 10 mg via ORAL
  Filled 2022-07-21: qty 2

## 2022-07-21 MED ORDER — LISINOPRIL 10 MG PO TABS
5.0000 mg | ORAL_TABLET | ORAL | Status: AC
  Administered 2022-07-21: 5 mg via ORAL
  Filled 2022-07-21: qty 1

## 2022-07-21 MED ORDER — CLONIDINE HCL 0.1 MG PO TABS
0.1000 mg | ORAL_TABLET | Freq: Once | ORAL | Status: AC
  Administered 2022-07-21: 0.1 mg via ORAL
  Filled 2022-07-21: qty 1

## 2022-07-21 MED ORDER — LISINOPRIL 10 MG PO TABS
10.0000 mg | ORAL_TABLET | Freq: Every day | ORAL | 0 refills | Status: DC
  Filled 2022-07-21: qty 30, 30d supply, fill #0

## 2022-07-21 MED ORDER — AMLODIPINE BESYLATE 10 MG PO TABS
10.0000 mg | ORAL_TABLET | Freq: Every day | ORAL | 0 refills | Status: DC
  Filled 2022-07-21: qty 30, 30d supply, fill #0

## 2022-07-21 NOTE — ED Provider Notes (Cosign Needed Addendum)
[place] EMERGENCY DEPARTMENT AT [facility] Provider Note   CSN: [id number] Arrival date & time: 07/21/22  1408     History  Chief Complaint  Patient presents with   Hypertension    Marc Mata is a 47 y.o. male with medical history of hypertension diagnosed in 2007.  Patient presents to ED for evaluation of high blood pressure, nasal congestion.  Patient reports that he went to urgent care earlier today due to chief complaint of nasal congestion, cough and right pinky toe pain.  Patient reports that at urgent care, they did an EKG, noted an abnormality and saw that his blood pressure was high and sent him to the ED for further management.  Patient states that he is currently prescribed amlodipine, lisinopril but has not taken this in the last year because he has been trying to control his blood pressure "naturally".  Patient states that he has been trying to focus on stress, salt intake and exercise.  Patient denies any focal weakness or numbness, chest pain, shortness of breath, headache, blurred vision.  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  Patient also complaining of right pinky toe pain, states they were still toed boots and believes that this is the reason for his big toe pain.  Patient reports remote history of asthma, states that he is a daily marijuana user as well as smokes 1 cigarette/day.  The patient denies any sore throat, fevers, body aches or chills.  Patient reports he is been try to control nasal congestion with Mucinex however symptoms or not relieved.  Patient reports history of seasonal allergies as well.   Hypertension       Home Medications Prior to Admission medications   Medication Sig Start Date End Date Taking? Authorizing Provider  amLODipine (NORVASC) 10 MG tablet Take 1 tablet (10 mg total) by mouth daily. 07/21/22   Barbette Merino, NP  ibuprofen (ADVIL) 800 MG tablet Take 1  tablet (800 mg total) by mouth every 8 (eight) hours as needed (pain). 08/05/21   Zenia Resides, MD  lisinopril (ZESTRIL) 10 MG tablet Take 1 tablet (10 mg total) by mouth daily. 07/21/22   Barbette Merino, NP      Allergies    Patient has no known allergies.    Review of Systems   Review of Systems  Physical Exam Updated Vital Signs BP (!) 181/112   Pulse 73   Temp 98.8 F (37.1 C) (Oral)   Resp 19   SpO2 99%  Physical Exam Vitals and nursing note reviewed.  Constitutional:      General: He is not in acute distress.    Appearance: He is not toxic-appearing.  HENT:     Head: Normocephalic and atraumatic.     Nose: Nose normal.     Mouth/Throat:     Mouth: Mucous membranes are moist.     Pharynx: Oropharynx is clear. No oropharyngeal exudate or posterior oropharyngeal erythema.  Eyes:     Extraocular Movements: Extraocular movements intact.     Conjunctiva/sclera: Conjunctivae normal.     Pupils: Pupils are equal, round, and reactive to light.  Cardiovascular:     Rate and Rhythm: Normal rate and regular rhythm.  Pulmonary:     Effort: Pulmonary effort is normal.     Breath sounds: Wheezing present.     Comments: Slight right upper expiratory wheeze Abdominal:     General: Abdomen is flat.  Bowel sounds are normal.     Palpations: Abdomen is soft.     Tenderness: There is no abdominal tenderness.  Musculoskeletal:     Cervical back: Normal range of motion and neck supple. No tenderness.  Skin:    General: Skin is warm and dry.     Capillary Refill: Capillary refill takes less than 2 seconds.  Neurological:     General: No focal deficit present.     Mental Status: He is alert and oriented to person, place, and time.     GCS: GCS eye subscore is 4. GCS verbal subscore is 5. GCS motor subscore is 6.     Cranial Nerves: Cranial nerves 2-12 are intact. No cranial nerve deficit.     Sensory: Sensation is intact. No sensory deficit.     Motor: Motor function is intact.  No weakness.     Coordination: Coordination is intact. Heel to [facility] Test normal.     Comments: Cranial nerves II through XII intact.  Intact finger-to-nose, heel-to-shin.  No pronator drift.  No slurred speech.  5 out of 5 grip strength upper extremities bilaterally.  5 out of 5 strength bilateral lower extremities.     ED Results / Procedures / Treatments   Labs (all labs ordered are listed, but only abnormal results are displayed) Labs Reviewed  BASIC METABOLIC PANEL - Abnormal; Notable for the following components:      Result Value   Glucose, Bld 108 (*)    Creatinine, Ser 1.55 (*)    GFR, Estimated 56 (*)    All other components within normal limits  SARS CORONAVIRUS 2 BY RT PCR  CBC  TROPONIN I (HIGH SENSITIVITY)  TROPONIN I (HIGH SENSITIVITY)    EKG None  Radiology DG Chest 2 View  Result Date: 07/21/2022 CLINICAL DATA:  Chest pain, hypertension EXAM: CHEST - 2 VIEW COMPARISON:  Chest radiograph 02/28/2017 FINDINGS: The cardiomediastinal contours are within normal limits. There is a possible 4 mm nodule projecting over the lower left lung. Lungs are otherwise clear. No pneumothorax or pleural effusion. No acute finding in the visualized skeleton. IMPRESSION: 1. No acute cardiopulmonary process. 2. Possible 4 mm nodule projecting over the lower left lung. Consider nonemergent chest CT. Electronically Signed   By: Emmaline Kluver M.D.   On: 07/21/2022 16:00    Procedures Procedures   Medications Ordered in ED Medications  amLODipine (NORVASC) tablet 10 mg (10 mg Oral Given 07/21/22 1558)  lisinopril (ZESTRIL) tablet 5 mg (5 mg Oral Given 07/21/22 1558)  albuterol (VENTOLIN HFA) 108 (90 Base) MCG/ACT inhaler 1 puff (1 puff Inhalation Given 07/21/22 1559)  cloNIDine (CATAPRES) tablet 0.1 mg (0.1 mg Oral Given 07/21/22 1842)    ED Course/ Medical Decision Making/ A&P  Medical Decision Making Amount and/or Complexity of Data Reviewed Labs: ordered. Radiology:  ordered.  Risk Prescription drug management.   47 year old no presents to ED for evaluation of nasal congestion, hypertension, right foot pain.  Please see HPI for further details.  On my examination the patient is afebrile and nontachycardic.  Lung sounds have slight right upper expiratory wheeze on examination however the patient's not hypoxic.  His abdomen is soft and compressible throughout.  Neurological examination without focal neurodeficits and reassuring.  Patient overall nontoxic in appearance.  Patient CBC unremarkable without leukocytosis or anemia.  BMP with creatinine of 1.55 however unchanged from baseline, no other electrolyte derangement.  Patient troponin 8, 8 with delta.  COVID testing negative.  Chest x-ray unremarkable  without consolidations or effusions.  EKG nonischemic.  Patient provided home blood pressure medication to include 10 mg amlodipine, 5 mg lisinopril.  Patient also given albuterol inhaler for slight wheezing.  On reassessment the patient wheezing is subsided after albuterol use.  Patient blood pressure still elevated after reassessment so we will add on 0.1 mg of clonidine.  Patient blood pressure most likely hard to control due to the fact the patient has been off of his blood pressure medication for the last 1 year.  After 0.1 mg clonidine administered, patient blood pressure has decreased to 181/112 at this time.  The patient is requesting discharge.  Patient states that he will follow-up with his PCP for further evaluation.  The patient was also advised that he has a elevated creatinine which has been present for 2 years which is concerning for chronic kidney disease.  For these reasons as well as blood pressure the patient will be advised to follow-up with his PCP.  The patient reports that he will get him an appointment with his PCP tomorrow. The patient was advised that if he develops chest pain, shortness of breath, headache or blurred vision in the setting  of hypertension he will need to return to the ED for further management. The patient was also advised to purchase a blood pressure monitor/cuff and to record his blood pressures at home.  Patient was advised that then take these measurements to his PCP for further management.  In terms of the patient's foot pain, he was advised to purchase wider toe box of steel toed boots and to begin taking Tylenol for pain.  Patient advised that this is most likely due to his steel toed boots.   For his nasal congestion the patient was advised to begin taking Claritin once daily, do sinus rinses. Return precautions were discussed and the patient voiced understanding.  Patient had all of his questions answered to his satisfaction.  Patient stable to discharge home at this time.  Patient case discussed with attending Dr. Criss Alvine who voiced agreement with plan of management.  Addendum: Before discharge patient states that he received prescription for blood pressure medication in urgent care.  Patient states that his girlfriend is already picked this up.   Final Clinical Impression(s) / ED Diagnoses Final diagnoses:  Secondary hypertension    Rx / DC Orders ED Discharge Orders     None             Clent Ridges 07/21/22 1953    Pricilla Loveless, MD 07/22/22 [phone]

## 2022-07-21 NOTE — ED Triage Notes (Addendum)
Pt present with cough and congestion x 4 days. Pt stated he is coughing up mucous. Pt is not taking anything at this time.  Pt reports right pinky toe pain.
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
## 2022-07-21 NOTE — ED Notes (Signed)
Got patient into a gown on the monitor patient is resting with call bell in reach 

## 2022-07-21 NOTE — ED Triage Notes (Signed)
Pt BIB [facility] from urgent care for BP 218/124. Pt reports he went to urgent care for a chest cold, he works outside and it had been raining. He states he has been trying to control his bp without meds, has not been taking his meds. Denies chest pain. C/o left shoulder pain but thinks this is related to using a sledge hammer at work.   BP 218/124 HR 52 97% room air

## 2022-07-21 NOTE — Discharge Instructions (Addendum)
Due to your elevated blood pressure and your abnormal EKG the recommendation is to be to be seen at the emergency department for further evaluation and treatment.

## 2022-07-21 NOTE — Discharge Instructions (Addendum)
Return to the ED with any new or worsening signs or symptoms Please read the attached guides concerning hypertension, how to take her blood pressure Please begin recording her blood pressure in the morning and at night.  Please use the form attached to record your blood pressure measurements and take these to your PCP Please begin taking your blood pressure medication in the morning once daily as prescribed For your foot pain, please purchase wider soled shoes or decrease use of steel toed shoe.  You may also take tylenol for pain.

## 2022-07-22 ENCOUNTER — Ambulatory Visit: Payer: Self-pay | Admitting: *Deleted

## 2022-07-22 NOTE — Telephone Encounter (Signed)
  Chief Complaint: OTC cold medication - what is safe to take with high BP Symptoms: cold/sinus symptoms  Disposition: ED /[] Urgent Care (no appt availability in office) / Appointment(In office/virtual)/  [place] [facility]/ Home Care/ Refused Recommended Disposition /[] [place] [facility]/  Follow-up with PCP Additional Notes: Patient advised Coricidin, plain Mucinex,  always ask pharmacist as good resource for safe OTC medictaions

## 2022-07-22 NOTE — Telephone Encounter (Signed)
Summary: Pt has questions about cold medication for patient's with high blood pressure   Pt requests call back to advise what type of cold medication he can take for patient's with high blood pressure. Cb# [phone]     Reason for Disposition  Caller has medicine question, adult has minor symptoms, caller declines triage, AND triager answers question  Answer Assessment - Initial Assessment Questions 1. NAME of MEDICINE: "What medicine(s) are you calling about?"     Patient wants to know what medications are safe to take with high BP 2. QUESTION: "What is your question?" (e.g., double dose of medicine, side effect)     Patient advised : Coricidin HBP Mucinex-plain- is usually fine If ever has concerns- ask pharmacist they can direct him to safest choices 3. PRESCRIBER: "Who prescribed the medicine?" Reason: if prescribed by specialist, call should be referred to that group.     OTC 4. SYMPTOMS: "Do you have any symptoms?" If Yes, ask: "What symptoms are you having?"  "How bad are the symptoms (e.g., mild, moderate, severe)     Cold symptoms  Protocols used: Medication Question Call-A-AH

## 2022-07-24 ENCOUNTER — Ambulatory Visit: Payer: Self-pay | Admitting: *Deleted

## 2022-07-24 NOTE — Telephone Encounter (Signed)
Reviewed with patient per pharmacy From an OTC standpoint, this patient should avoid products with decongestants due to his high blood pressure. While the decongestant in Tylenol Cold & Flu is minimally absorbed, it could theoretically increase BP. A safer option in patients with high blood pressure is Coricidin HBP Cough & Cold.   Patient voiced understanding of all discussed and will purchase Coricidin HBP Cough & Cold as recommended

## 2022-07-24 NOTE — Telephone Encounter (Signed)
Summary: High blood pressure,cold, Tylenol Cold & Flu?   The patient called in the other day about having a cold and wanting to take medicine but concerned with his high blood pressure. He just wants to make sure he is ok to take Tylenol Cold & Flu with his hbp. Please assist patient further.      Chief Complaint: requesting advice regarding OTC medications safe to take while taking BP medications, lisinopril, Norvasc. Wants to take tylenol cold and flu.  Symptoms: did not report systems Frequency: na Pertinent Negatives: Patient denies na Disposition: ED /[] Urgent Care (no appt availability in office) / Appointment(In office/virtual)/  [place] [facility]/ Home Care/ Refused Recommended Disposition /[] [place] [facility]/  Follow-up with PCP Additional Notes:   Recommended to contact pharmacist to review be options. Patient requesting if pharmacist can call him back. Please advise .      Reason for Disposition  [1] Caller requesting NON-URGENT health information AND [2] PCP's office is the best resource  Answer Assessment - Initial Assessment Questions 1. REASON FOR CALL or QUESTION: "What is your reason for calling today?" or "How can I best help you?" or "What question do you have that I can help answer?"     Requesting advice which OTC medications can be taken while taking BP medications. Lisinopril and norvasc.  Protocols used: Information Only Call - No Triage-A-AH

## 2022-08-06 ENCOUNTER — Ambulatory Visit: Payer: Self-pay | Admitting: Physician Assistant

## 2022-08-11 ENCOUNTER — Ambulatory Visit: Payer: Self-pay | Admitting: Physician Assistant

## 2022-08-11 NOTE — Progress Notes (Deleted)
Patient ID: Marc Mata, male   DOB: 03/14/1976, 47 y.o.   MRN: [id number]   After being seen at the ED 4/15  Marc Mata is a 47 y.o. male with medical history of hypertension diagnosed in 2007.  Patient presents to ED for evaluation of high blood pressure, nasal congestion.  Patient reports that he went to urgent care earlier today due to chief complaint of nasal congestion, cough and right pinky toe pain.  Patient reports that at urgent care, they did an EKG, noted an abnormality and saw that his blood pressure was high and sent him to the ED for further management.  Patient states that he is currently prescribed amlodipine, lisinopril but has not taken this in the last year because he has been trying to control his blood pressure "naturally".  Patient states that he has been trying to focus on stress, salt intake and exercise.  Patient denies any focal weakness or numbness, chest pain, shortness of breath, headache, blurred vision.  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  Patient also complaining of right pinky toe pain, states they were still toed boots and believes that this is the reason for his big toe pain.  Patient reports remote history of asthma, states that he is a daily marijuana user as well as smokes 1 cigarette/day.  The patient denies any sore throat, fevers, body aches or chills.  Patient reports he is been try to control nasal congestion with Mucinex however symptoms or not relieved.  Patient reports history of seasonal allergies as well.   From A/P: 47 year old no presents to ED for evaluation of nasal congestion, hypertension, right foot pain.  Please see HPI for further details.   On my examination the patient is afebrile and nontachycardic.  Lung sounds have slight right upper expiratory wheeze on examination however the patient's not hypoxic.  His abdomen is soft and compressible throughout.  Neurological examination  without focal neurodeficits and reassuring.  Patient overall nontoxic in appearance.   Patient CBC unremarkable without leukocytosis or anemia.  BMP with creatinine of 1.55 however unchanged from baseline, no other electrolyte derangement.  Patient troponin 8, 8 with delta.  COVID testing negative.  Chest x-ray unremarkable without consolidations or effusions.  EKG nonischemic.   Patient provided home blood pressure medication to include 10 mg amlodipine, 5 mg lisinopril.  Patient also given albuterol inhaler for slight wheezing.  On reassessment the patient wheezing is subsided after albuterol use.   Patient blood pressure still elevated after reassessment so we will add on 0.1 mg of clonidine.  Patient blood pressure most likely hard to control due to the fact the patient has been off of his blood pressure medication for the last 1 year.   After 0.1 mg clonidine administered, patient blood pressure has decreased to 181/112 at this time.  The patient is requesting discharge.  Patient states that he will follow-up with his PCP for further evaluation.  The patient was also advised that he has a elevated creatinine which has been present for 2 years which is concerning for chronic kidney disease.  For these reasons as well as blood pressure the patient will be advised to follow-up with his PCP.  The patient reports that he will get him an appointment with his PCP tomorrow. The patient was advised that if he develops chest pain, shortness of breath, headache or blurred vision in the setting of hypertension he will need to return to  the ED for further management. The patient was also advised to purchase a blood pressure monitor/cuff and to record his blood pressures at home.  Patient was advised that then take these measurements to his PCP for further management.   In terms of the patient's foot pain, he was advised to purchase wider toe box of steel toed boots and to begin taking Tylenol for pain.  Patient  advised that this is most likely due to his steel toed boots.    For his nasal congestion the patient was advised to begin taking Claritin once daily, do sinus rinses. Return precautions were discussed and the patient voiced understanding.  Patient had all of his questions answered to his satisfaction.  Patient stable to discharge home at this time.   Patient case discussed with attending Dr. Criss Alvine who voiced agreement with plan of management.   Addendum: Before discharge patient states that he received prescription for blood pressure medication in urgent care.  Patient states that his girlfriend is already picked this up.

## 2022-09-05 ENCOUNTER — Other Ambulatory Visit: Payer: Self-pay | Admitting: Nurse Practitioner

## 2022-09-05 ENCOUNTER — Other Ambulatory Visit: Payer: Self-pay

## 2022-09-05 DIAGNOSIS — I10 Essential (primary) hypertension: Secondary | ICD-10-CM

## 2022-09-10 ENCOUNTER — Other Ambulatory Visit: Payer: Self-pay

## 2022-09-18 ENCOUNTER — Other Ambulatory Visit: Payer: Self-pay

## 2022-09-22 ENCOUNTER — Other Ambulatory Visit: Payer: Self-pay | Admitting: Internal Medicine

## 2022-09-22 DIAGNOSIS — I10 Essential (primary) hypertension: Secondary | ICD-10-CM

## 2022-09-22 NOTE — Telephone Encounter (Signed)
Requested medication (s) are due for refill today: yes  Requested medication (s) are on the active medication list:   yes  Last refill: Both meds 07/21/22  #30  0 refills  Future visit scheduled    yes 7/10.24  Notes to clinic:Historical provider, please review. Thank you.  Requested Prescriptions  Pending Prescriptions Disp Refills   amLODipine (NORVASC) 10 MG tablet 30 tablet 0    Sig: Take 1 tablet (10 mg total) by mouth daily.     Cardiovascular: Calcium Channel Blockers 2 Failed - 09/22/2022  9:16 AM      Failed - Last BP in normal range    BP Readings from Last 1 Encounters:  07/21/22 (!) 183/119         Failed - Valid encounter within last 6 months    Recent Outpatient Visits           2 years ago Essential hypertension   [place] [facility] Marcine Matar, MD   3 years ago Essential hypertension   [place] [facility] Marcine Matar, MD   3 years ago Chronic folliculitis   [place] [facility] [place], [place], [place]   3 years ago Essential hypertension   [place] [facility] Marcine Matar, MD   4 years ago Essential hypertension   [place] [facility] Marcine Matar, MD       Future Appointments             In 3 weeks [place] [facility]            Passed - Last Heart Rate in normal range    Pulse Readings from Last 1 Encounters:  07/21/22 76          lisinopril (ZESTRIL) 10 MG tablet 30 tablet 0    Sig: Take 1 tablet (10 mg total) by mouth daily.     Cardiovascular:  ACE Inhibitors Failed - 09/22/2022  9:16 AM      Failed - Cr in normal range and within 180 days    Creat  Date Value Ref Range Status  02/05/2016 1.01 0.60 - 1.35 mg/dL Final   Creatinine, Ser  Date Value Ref Range Status  07/21/2022 1.55 (H) 0.61 - 1.24 mg/dL Final         Failed -  Last BP in normal range    BP Readings from Last 1 Encounters:  07/21/22 (!) 183/119         Failed - Valid encounter within last 6 months    Recent Outpatient Visits           2 years ago Essential hypertension   [place] [facility] Marcine Matar, MD   3 years ago Essential hypertension   [place] [facility] Marcine Matar, MD   3 years ago Chronic folliculitis   [facility] [place], [place], [place]   3 years ago Essential hypertension    [facility] Marcine Matar, MD   4 years ago Essential hypertension    [facility] Marcine Matar, MD       Future Appointments             In 3 weeks [place], [place] [facility]            Passed - K in normal range and within 180 days    Potassium  Date Value Ref Range Status  07/21/2022 4.0 3.5 - 5.1 mmol/L Final         Passed - Patient is not pregnant

## 2022-09-22 NOTE — Telephone Encounter (Signed)
Medication Refill - Medication: amLODipine (NORVASC) 10 MG tablet  lisinopril (ZESTRIL) 10 MG tablet   Has the patient contacted their pharmacy? Yes.   Pt was advised by pharmacy that he needs to schedule and appt before a refill can be done.   Pt scheduled an appt for 10/15/22 and is wanting to see if he can get a short supply until his appt.   Preferred Pharmacy (with phone number or street name): [facility] Phone: [phone] Fax: [phone]  Has the patient been seen for an appointment in the last year OR does the patient have an upcoming appointment? Yes.    Agent: Please be advised that RX refills may take up to 3 business days. We ask that you follow-up with your pharmacy.

## 2022-09-22 NOTE — Telephone Encounter (Signed)
Patient request short supply until OV 10/15/22

## 2022-09-23 ENCOUNTER — Other Ambulatory Visit (HOSPITAL_COMMUNITY): Payer: Self-pay

## 2022-09-23 ENCOUNTER — Other Ambulatory Visit: Payer: Self-pay

## 2022-09-23 MED ORDER — AMLODIPINE BESYLATE 10 MG PO TABS
10.0000 mg | ORAL_TABLET | Freq: Every day | ORAL | 0 refills | Status: DC
  Filled 2022-09-23 (×2): qty 30, 30d supply, fill #0

## 2022-09-24 ENCOUNTER — Other Ambulatory Visit: Payer: Self-pay

## 2022-10-02 ENCOUNTER — Other Ambulatory Visit (HOSPITAL_COMMUNITY): Payer: Self-pay

## 2022-10-15 ENCOUNTER — Ambulatory Visit: Payer: 59 | Admitting: Physician Assistant

## 2023-02-04 ENCOUNTER — Ambulatory Visit: Payer: Self-pay

## 2023-02-04 NOTE — Telephone Encounter (Signed)
      Chief Complaint: BP 200/100. Chest pain, headache,dizzy. Has been out of medications x 8 months ago. Symptoms: Above Frequency: This week Pertinent Negatives: Patient denies  Disposition: [x] ED /[] Urgent Care (no appt availability in office) / [] Appointment(In office/virtual)/ []  [place] [facility]/ [] Home Care/ [] Refused Recommended Disposition /[] [place] [facility]/ []  Follow-up with PCP Additional Notes: Pt. Agrees with ED.  Reason for Disposition  [1] Systolic BP  >= 160 OR Diastolic >= 100 AND [2] cardiac (e.g., breathing difficulty, chest pain) or neurologic symptoms (e.g., new-onset blurred or double vision, unsteady gait)  Answer Assessment - Initial Assessment Questions 1. BLOOD PRESSURE: "What is the blood pressure?" "Did you take at least two measurements 5 minutes apart?"     200/100 2. ONSET: "When did you take your blood pressure?"     Today 3. HOW: "How did you take your blood pressure?" (e.g., automatic home BP monitor, visiting nurse)     Home cuff 4. HISTORY: "Do you have a history of high blood pressure?"     Yes 5. MEDICINES: "Are you taking any medicines for blood pressure?" "Have you missed any doses recently?"     Has been out of medicines x 8 months 6. OTHER SYMPTOMS: "Do you have any symptoms?" (e.g., blurred vision, chest pain, difficulty breathing, headache, weakness)     Headache, chest pain, dizzy 7. PREGNANCY: "Is there any chance you are pregnant?" "When was your last menstrual period?"     N/a  Protocols used: Blood Pressure - High-A-AH

## 2023-03-18 ENCOUNTER — Other Ambulatory Visit: Payer: Self-pay | Admitting: Internal Medicine

## 2023-03-18 ENCOUNTER — Other Ambulatory Visit: Payer: Self-pay

## 2023-03-18 DIAGNOSIS — I10 Essential (primary) hypertension: Secondary | ICD-10-CM

## 2023-03-19 ENCOUNTER — Other Ambulatory Visit: Payer: Self-pay

## 2023-07-15 ENCOUNTER — Telehealth (INDEPENDENT_AMBULATORY_CARE_PROVIDER_SITE_OTHER): Payer: Self-pay | Admitting: Primary Care

## 2023-07-15 NOTE — Telephone Encounter (Signed)
 Called pt to confirm appt but pt did not answer and could not leave VM

## 2023-07-21 ENCOUNTER — Telehealth (INDEPENDENT_AMBULATORY_CARE_PROVIDER_SITE_OTHER): Payer: Self-pay | Admitting: Primary Care

## 2023-07-21 NOTE — Telephone Encounter (Signed)
 Called pt to confirm appt. Pt did not answer and could not leave VM

## 2023-07-22 ENCOUNTER — Ambulatory Visit (INDEPENDENT_AMBULATORY_CARE_PROVIDER_SITE_OTHER): Payer: 59 | Admitting: Primary Care

## 2023-09-10 ENCOUNTER — Ambulatory Visit (INDEPENDENT_AMBULATORY_CARE_PROVIDER_SITE_OTHER): Admitting: Primary Care

## 2024-03-06 ENCOUNTER — Emergency Department (HOSPITAL_COMMUNITY): Payer: PRIVATE HEALTH INSURANCE

## 2024-03-06 ENCOUNTER — Inpatient Hospital Stay (HOSPITAL_COMMUNITY)
Admission: EM | Admit: 2024-03-06 | Discharge: 2024-03-09 | DRG: 304 | Disposition: A | Payer: PRIVATE HEALTH INSURANCE | Attending: Internal Medicine | Admitting: Internal Medicine

## 2024-03-06 DIAGNOSIS — F1490 Cocaine use, unspecified, uncomplicated: Secondary | ICD-10-CM

## 2024-03-06 DIAGNOSIS — F1410 Cocaine abuse, uncomplicated: Secondary | ICD-10-CM

## 2024-03-06 DIAGNOSIS — I161 Hypertensive emergency: Principal | ICD-10-CM

## 2024-03-06 DIAGNOSIS — N179 Acute kidney failure, unspecified: Secondary | ICD-10-CM

## 2024-03-06 DIAGNOSIS — I674 Hypertensive encephalopathy: Secondary | ICD-10-CM

## 2024-03-06 DIAGNOSIS — I10 Essential (primary) hypertension: Secondary | ICD-10-CM

## 2024-03-06 DIAGNOSIS — R519 Headache, unspecified: Secondary | ICD-10-CM

## 2024-03-06 DIAGNOSIS — I6783 Posterior reversible encephalopathy syndrome: Secondary | ICD-10-CM

## 2024-03-06 LAB — CBC
HCT: 43.1 % (ref 39.0–52.0)
Hemoglobin: 14.9 g/dL (ref 13.0–17.0)
MCH: 29.6 pg (ref 26.0–34.0)
MCHC: 34.6 g/dL (ref 30.0–36.0)
MCV: 85.7 fL (ref 80.0–100.0)
Platelets: 189 K/uL (ref 150–400)
RBC: 5.03 MIL/uL (ref 4.22–5.81)
RDW: 12.5 % (ref 11.5–15.5)
WBC: 10.8 K/uL — ABNORMAL HIGH (ref 4.0–10.5)
nRBC: 0 % (ref 0.0–0.2)

## 2024-03-06 LAB — BASIC METABOLIC PANEL WITH GFR
Anion gap: 12 (ref 5–15)
Anion gap: 14 (ref 5–15)
BUN: 31 mg/dL — ABNORMAL HIGH (ref 6–20)
BUN: 27 mg/dL — ABNORMAL HIGH (ref 6–20)
CO2: 24 mmol/L (ref 22–32)
CO2: 24 mmol/L (ref 22–32)
Calcium: 9.4 mg/dL (ref 8.9–10.3)
Calcium: 9.2 mg/dL (ref 8.9–10.3)
Chloride: 101 mmol/L (ref 98–111)
Chloride: 98 mmol/L (ref 98–111)
Creatinine, Ser: 2.38 mg/dL — ABNORMAL HIGH (ref 0.61–1.24)
Creatinine, Ser: 2.43 mg/dL — ABNORMAL HIGH (ref 0.61–1.24)
GFR, Estimated: 33 mL/min — ABNORMAL LOW (ref >60)
GFR, Estimated: 32 mL/min — ABNORMAL LOW (ref >60)
Glucose, Bld: 101 mg/dL — ABNORMAL HIGH (ref 70–99)
Glucose, Bld: 151 mg/dL — ABNORMAL HIGH (ref 70–99)
Potassium: 5.3 mmol/L — ABNORMAL HIGH (ref 3.5–5.1)
Potassium: 4 mmol/L (ref 3.5–5.1)
Sodium: 137 mmol/L (ref 135–145)
Sodium: 136 mmol/L (ref 135–145)

## 2024-03-06 LAB — CBC WITH DIFFERENTIAL/PLATELET
Abs Immature Granulocytes: 0.02 K/uL (ref 0.00–0.07)
Basophils Absolute: 0 K/uL (ref 0.0–0.1)
Basophils Relative: 0 %
Eosinophils Absolute: 0 K/uL (ref 0.0–0.5)
Eosinophils Relative: 0 %
HCT: 46.2 % (ref 39.0–52.0)
Hemoglobin: 15.7 g/dL (ref 13.0–17.0)
Immature Granulocytes: 0 %
Lymphocytes Relative: 12 %
Lymphs Abs: 1.2 K/uL (ref 0.7–4.0)
MCH: 29.6 pg (ref 26.0–34.0)
MCHC: 34 g/dL (ref 30.0–36.0)
MCV: 87.2 fL (ref 80.0–100.0)
Monocytes Absolute: 0.7 K/uL (ref 0.1–1.0)
Monocytes Relative: 7 %
Neutro Abs: 8.6 K/uL — ABNORMAL HIGH (ref 1.7–7.7)
Neutrophils Relative %: 81 %
Platelets: 181 K/uL (ref 150–400)
RBC: 5.3 MIL/uL (ref 4.22–5.81)
RDW: 12.7 % (ref 11.5–15.5)
WBC: 10.6 K/uL — ABNORMAL HIGH (ref 4.0–10.5)
nRBC: 0 % (ref 0.0–0.2)

## 2024-03-06 LAB — RAPID URINE DRUG SCREEN, HOSP PERFORMED
Amphetamines: NOT DETECTED
Barbiturates: NOT DETECTED
Benzodiazepines: NOT DETECTED
Cocaine: POSITIVE — AB
Opiates: NOT DETECTED
Tetrahydrocannabinol: POSITIVE — AB

## 2024-03-06 LAB — GLUCOSE, CAPILLARY: Glucose-Capillary: 96 mg/dL (ref 70–99)

## 2024-03-06 LAB — HIV ANTIBODY (ROUTINE TESTING W REFLEX): HIV Screen 4th Generation wRfx: NONREACTIVE

## 2024-03-06 LAB — MAGNESIUM: Magnesium: 2.5 mg/dL — ABNORMAL HIGH (ref 1.7–2.4)

## 2024-03-06 LAB — ETHANOL: Alcohol, Ethyl (B): <15 mg/dL (ref <15)

## 2024-03-06 LAB — CK: Total CK: 205 U/L (ref 49–397)

## 2024-03-06 LAB — MRSA NEXT GEN BY PCR, NASAL: MRSA by PCR Next Gen: NOT DETECTED

## 2024-03-06 MED ORDER — HYDROMORPHONE HCL 1 MG/ML IJ SOLN
1.0000 mg | Freq: Once | INTRAMUSCULAR | Status: AC
  Administered 2024-03-06: 1 mg via INTRAVENOUS
  Filled 2024-03-06: qty 1

## 2024-03-06 MED ORDER — HYDRALAZINE HCL 20 MG/ML IJ SOLN
10.0000 mg | INTRAMUSCULAR | Status: AC
  Administered 2024-03-06: 10 mg via INTRAVENOUS
  Filled 2024-03-06: qty 1

## 2024-03-06 MED ORDER — ORAL CARE MOUTH RINSE: 15.0000 mL | OROMUCOSAL | Status: DC | PRN

## 2024-03-06 MED ORDER — ONDANSETRON HCL 4 MG/2ML IJ SOLN: 4.0000 mg | Freq: Four times a day (QID) | INTRAMUSCULAR | Status: DC | PRN

## 2024-03-06 MED ORDER — METOCLOPRAMIDE HCL 5 MG/ML IJ SOLN
10.0000 mg | Freq: Once | INTRAMUSCULAR | Status: AC
  Administered 2024-03-06: 10 mg via INTRAVENOUS
  Filled 2024-03-06: qty 2

## 2024-03-06 MED ORDER — CLEVIDIPINE BUTYRATE 0.5 MG/ML IV EMUL
0.0000 mg/h | INTRAVENOUS | Status: DC
  Administered 2024-03-06: 2 mg/h via INTRAVENOUS

## 2024-03-06 MED ORDER — POLYETHYLENE GLYCOL 3350 17 G PO PACK: 17.0000 g | PACK | Freq: Every day | ORAL | Status: DC | PRN

## 2024-03-06 MED ORDER — MAGNESIUM SULFATE 2 GM/50ML IV SOLN
2.0000 g | Freq: Once | INTRAVENOUS | Status: AC
  Administered 2024-03-06: 2 g via INTRAVENOUS
  Filled 2024-03-06: qty 50

## 2024-03-06 MED ORDER — LACTATED RINGERS IV SOLN: INTRAVENOUS | Status: AC

## 2024-03-06 MED ORDER — CHLORHEXIDINE GLUCONATE CLOTH 2 % EX PADS
6.0000 | MEDICATED_PAD | Freq: Every day | CUTANEOUS | Status: DC
  Administered 2024-03-06: 6 via TOPICAL

## 2024-03-06 MED ORDER — LORAZEPAM 2 MG/ML IJ SOLN
1.0000 mg | Freq: Once | INTRAMUSCULAR | Status: AC
  Administered 2024-03-06: 1 mg via INTRAVENOUS
  Filled 2024-03-06: qty 1

## 2024-03-06 MED ORDER — HEPARIN SODIUM (PORCINE) 5000 UNIT/ML IJ SOLN
5000.0000 [IU] | Freq: Three times a day (TID) | INTRAMUSCULAR | Status: DC
  Administered 2024-03-06 – 2024-03-09 (×9): 5000 [IU] via SUBCUTANEOUS
  Filled 2024-03-06 (×9): qty 1

## 2024-03-06 MED ORDER — HYDRALAZINE HCL 20 MG/ML IJ SOLN
10.0000 mg | Freq: Once | INTRAMUSCULAR | Status: AC
  Administered 2024-03-06: 10 mg via INTRAVENOUS
  Filled 2024-03-06: qty 1

## 2024-03-06 MED ORDER — LACTATED RINGERS IV BOLUS
1000.0000 mL | Freq: Once | INTRAVENOUS | Status: AC
  Administered 2024-03-06: 1000 mL via INTRAVENOUS

## 2024-03-06 MED ORDER — AMLODIPINE BESYLATE 10 MG PO TABS
10.0000 mg | ORAL_TABLET | Freq: Every day | ORAL | Status: DC
  Administered 2024-03-07 – 2024-03-09 (×3): 10 mg via ORAL
  Filled 2024-03-06 (×3): qty 1

## 2024-03-06 MED ORDER — AMLODIPINE BESYLATE 5 MG PO TABS: 10.0000 mg | ORAL_TABLET | Freq: Every day | ORAL | Status: DC

## 2024-03-06 MED ORDER — DOCUSATE SODIUM 100 MG PO CAPS: 100.0000 mg | ORAL_CAPSULE | Freq: Two times a day (BID) | ORAL | Status: DC | PRN

## 2024-03-06 MED ORDER — CLEVIDIPINE BUTYRATE 0.5 MG/ML IV EMUL
0.0000 mg/h | INTRAVENOUS | Status: DC
  Administered 2024-03-06: 4 mg/h via INTRAVENOUS
  Administered 2024-03-06: 14 mg/h via INTRAVENOUS
  Administered 2024-03-07: 12 mg/h via INTRAVENOUS
  Administered 2024-03-07: 10 mg/h via INTRAVENOUS
  Administered 2024-03-07: 2 mg/h via INTRAVENOUS
  Administered 2024-03-07: 16 mg/h via INTRAVENOUS
  Administered 2024-03-07 – 2024-03-08 (×2): 12 mg/h via INTRAVENOUS
  Filled 2024-03-06 (×2): qty 100
  Filled 2024-03-06: qty 200
  Filled 2024-03-06 (×3): qty 100

## 2024-03-06 MED ORDER — DIPHENHYDRAMINE HCL 50 MG/ML IJ SOLN
12.5000 mg | Freq: Once | INTRAMUSCULAR | Status: AC
  Administered 2024-03-06: 12.5 mg via INTRAVENOUS
  Filled 2024-03-06: qty 1

## 2024-03-06 NOTE — ED Triage Notes (Signed)
 Pt here for HTN and HA that started 4 hrs ago. Tried w lying down and drinking water which made it worse. C/o all on L side. Reported tingling and numbness on the left side of the face which is now resolved. Never experienced this before. Hx HTN. Not complaint w HTN meds.   Bp 220/140 Hr 100  95% RA

## 2024-03-06 NOTE — Consult Note (Addendum)
 NEUROLOGY CONSULT NOTE   Date of service: March 06, 2024 Patient Name: Marc Mata MRN:  [id number] DOB:  1975-10-31 Chief Complaint: h/a, AMS Requesting Provider: Geronimo Amel, MD  History of Present Illness  Marc Mata is a 48 y.o. male with hx of asthma and HTN not on any home medications. He was upset over relationship troubles last evening when he began experiencing sudden onset headache. He admits to cocaine and ETOH use. Found to have AKI, hyperkalemia, and visibly tearful regarding his circumstances. Admitted to ICU for BP control and neurology consulted to rule out PRES.  He denies numbness, tingling, focal weakness (just has weakness all over), vision changes, problems with speech, swallowing. He denies current headache, having just gotten migraine cocktail. No loss of bladder or bowel function or other focal neurological deficits. + abdominal pain and diarrhea prior to arrival.  NIHSS components Score: Comment  1a Level of Conscious 0[]  1[x]  2[]  3[]      1b LOC Questions 0[]  1[x]  2[]       1c LOC Commands 0[x]  1[]  2[]       2 Best Gaze 0[x]  1[]  2[]       3 Visual 0[x]  1[]  2[]  3[]      4 Facial Palsy 0[x]  1[]  2[]  3[]      5a Motor Arm - left 0[x]  1[]  2[]  3[]  4[]  UN[]    5b Motor Arm - Right 0[x]  1[]  2[]  3[]  4[]  UN[]    6a Motor Leg - Left 0[x]  1[]  2[]  3[]  4[]  UN[]    6b Motor Leg - Right 0[x]  1[]  2[]  3[]  4[]  UN[]    7 Limb Ataxia 0[x]  1[]  2[]  UN[]      8 Sensory 0[x]  1[]  2[]  UN[]      9 Best Language 0[x]  1[]  2[]  3[]      10 Dysarthria 0[x]  1[]  2[]  UN[]      11 Extinct. and Inattention 0[x]  1[]  2[]       TOTAL: 2      ROS  Comprehensive ROS performed and pertinent positives documented in HPI. Limited iso AMS.  Past History   Past Medical History:  Diagnosis Date   Hypertension Dx 2007    Past Surgical History:  Procedure Laterality Date   APPENDECTOMY  1996    Family History: Family History  Problem Relation Age of Onset   Hypertension Mother    Heart disease  Mother    Cancer Father    Hypertension Brother    Diabetes Sister     Social History  reports that he has been smoking cigarettes. He has never used smokeless tobacco. He reports current alcohol use. He reports current drug use. Drugs: Codeine , Marijuana, and Cocaine.  No Known Allergies  Medications   Current Facility-Administered Medications:    clevidipine (CLEVIPREX) infusion 0.5 mg/mL, 0-21 mg/hr, Intravenous, Continuous, Yolande Lamar BROCKS, MD, Last Rate: 32 mL/hr at 03/06/24 1320, 16 mg/hr at 03/06/24 1320   magnesium sulfate IVPB 2 g 50 mL, 2 g, Intravenous, Once, Yolande Lamar BROCKS, MD, Last Rate: 50 mL/hr at 03/06/24 1320, Infusion Verify at 03/06/24 1320  Vitals   Vitals:   03/06/24 1240 03/06/24 1257 03/06/24 1302 03/06/24 1321  BP: (!) 179/102 (!) 203/113 (!) 174/102 (!) 167/93  Pulse:  82 96 (!) 105  Resp:  18  (!) 27  Temp:  97.8 F (36.6 C)  99.6 F (37.6 C)  TempSrc:    Oral  SpO2:  100% 100% 99%    There is no height or weight on file to calculate BMI.   Physical Exam  Constitutional: Appears well-developed and well-nourished. Slightly warm to touch   Psych: Affect appropriate to situation.  Eyes: No scleral injection.  HENT: No OP obstruction.  Head: Normocephalic.  Cardiovascular: Normal rate and regular rhythm.  Respiratory: Effort normal, non-labored breathing.  GI: Soft.  No distension. There is no tenderness.  Skin: WDI.   Neurologic Examination   NEURO:  Mental Status: AA&Ox3 but not to month . Lethargic but arouses to verbal stimulus.  Language: speech is clear.  In tact naming, repetition, fluency, and comprehension. Cranial Nerves:  II: PERRL. Visual fields full to confrontation to finger counting.  III, IV, VI: EOM in tact. Eyelids elevate symmetrically.  V: Sensation intact V1-3 symmetrically  VII: no facial asymmetry   VIII: hearing intact to voice IX, X: Palate elevates symmetrically. Phonation is normal.  KP:Dynloizm shrug  5/5 and symmetrical  XII: tongue is midline without fasciculations. Motor:   Mvmt Root Nerve  Muscle Right Left Comments  SA C5/6 Ax Deltoid 5 5   EF C5/6 Mc Biceps 5 5   EE C6/7/8 Rad Triceps 5 5   WF C6/7 Med FCR 5 5   WE C7/8 PIN ECU 5 5   F Ab C8/T1 U ADM/FDI 5 5   HF L1/2/3 Fem Illopsoas 5 5   KE L2/3/4 Fem Quad 5 5   DF L4/5 D Peron Tib Ant 5 5   PF S1/2 Tibial Grc/Sol 5 5    Reflexes:  Right Left Comments  Pectoralis      Biceps (C5/6) 3 3   Brachioradialis (C5/6) 3 3    Triceps (C6/7) 3 3    Patellar (L3/4) 3 3    Achilles (S1) 3 3    Hoffman - -    Plantar - -   Jaw jerk    Sensation:  Light touch Intact    Pin prick    Temperature    Vibration   Proprioception     Tone: is normal and bulk is normal Coordination: FTN intact bilaterally, no ataxia in BLE., no abnormal movements  Gait- deferred   Labs/Imaging/Neurodiagnostic studies   CBC:  Recent Labs  Lab 03/18/2024 0942  WBC 10.6*  NEUTROABS 8.6*  HGB 15.7  HCT 46.2  MCV 87.2  PLT 181   Basic Metabolic Panel:  Lab Results  Component Value Date   NA 137 03/18/2024   K 5.3 (H) 18-Mar-2024   CO2 24 18-Mar-2024   GLUCOSE 101 (H) 18-Mar-2024   BUN 31 (H) 18-Mar-2024   CREATININE 2.38 (H) 2024-03-18   CALCIUM  9.4 2024-03-18   GFRNONAA 33 (L) Mar 18, 2024   GFRAA 60 08/23/2019   Lipid Panel:  Lab Results  Component Value Date   LDLCALC 66 08/23/2019   HgbA1c:  Lab Results  Component Value Date   HGBA1C 5.6 08/23/2019   Urine Drug Screen:     Component Value Date/Time   LABOPIA NONE DETECTED 02/13/2014 0737   COCAINSCRNUR POSITIVE (A) 02/13/2014 0737   LABBENZ NONE DETECTED 02/13/2014 0737   AMPHETMU NONE DETECTED 02/13/2014 0737   THCU POSITIVE (A) 02/13/2014 0737   LABBARB NONE DETECTED 02/13/2014 0737    Alcohol Level     Component Value Date/Time   ETH <15 March 18, 2024 0942   INR No results found for: INR APTT No results found for: APTT AED levels: No results found for:  PHENYTOIN, ZONISAMIDE, LAMOTRIGINE, LEVETIRACETA  CT Head without contrast(Personally reviewed): BRAIN AND VENTRICLES: No acute intracranial hemorrhage. No mass effect or midline shift. No extra-axial  fluid collection. No evidence of acute infarct. No hydrocephalus.   ORBITS: No acute abnormality.   SINUSES AND MASTOIDS: No acute abnormality.   SOFT TISSUES AND SKULL: No acute skull fracture. No acute soft tissue abnormality.   IMPRESSION: 1. No acute intracranial abnormality  MRI Brain(Personally reviewed): N/a   Neurodiagnostics rEEG:  N/a   ASSESSMENT and RECOMMENDATIONS   Toxic Metabolic Encephalopathy +/- PRES  Contributors AKI and hypertensive emergency. Headache appears to be improving and no noted seizure activity nor visual disturbances suggestive of PRES. There are no focal examination findings. Suspect slightly lethargic iso migraine cocktail received in ER. Easy to arouse to verbal stimulus.  Low yield MRI brain,  even if PRES present, would likely not change our treatment to gradually reduce blood pressure and continue treating underlying causes of encephalopathy.  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  PPRESS is possible especially in setting of cocaine abuse.  No need for further imaging at this point.  Exam nonfocal.  If he has seizures can reach back out to neurology.  Avoid cocaine use.  Systolic blood pressure below 839  Neurology will sign off.  Dr. Nichola evaluated pt independently, reviewed imaging, chart, labs. Discussed and formulated plan with the Resident/APP. Changes were made to the note where  appropriate. Please see APP/resident note above for details.      This patient is critically ill due to PRESS and at significant risk of neurological worsening, death form heart failure, respiratory failure, recurrent stroke, bleeding from [facility], seizure, sepsis. This patient's care requires constant monitoring of vital signs, hemodynamics, respiratory and cardiac monitoring, review of multiple databases, neurological assessment, discussion with family, other specialists and medical decision making of high complexity. I spent 45 minutes of neurocritical care time in the care of this patient.   Horace Wishon,MD

## 2024-03-06 NOTE — ED Provider Notes (Signed)
 [place] EMERGENCY DEPARTMENT AT [facility] Provider Note   CSN: [id number] Arrival date & time: 03/06/24  9078     Patient presents with: Hypertension and Headache   Marc Mata is a 48 y.o. male.   48 year old male with a history of hypertension not currently on any medications who presents emergency department headache.  Patient reports that he has been having some relationship trouble with a girl and was up all night drinking, using marijuana, and cocaine.  4 hours ago he developed a severe sudden onset left-sided occipital headache.  Pounding and pulsating.  Light hurts it.  Has also had some nausea and vomiting.  Denies any vision changes, numbness, or weakness to me.  Did say that he had some paresthesias in triage but has not complained of this to me at all.  Has not taken his blood pressure medication in over 2 years       Prior to Admission medications   Medication Sig Start Date End Date Taking? Authorizing Provider  amLODipine  (NORVASC ) 10 MG tablet Take 1 tablet (10 mg total) by mouth daily. 09/23/22   Vicci Barnie NOVAK, MD  ibuprofen  (ADVIL ) 800 MG tablet Take 1 tablet (800 mg total) by mouth every 8 (eight) hours as needed (pain). 08/05/21   Vonna Sharlet POUR, MD  lisinopril  (ZESTRIL ) 10 MG tablet Take 1 tablet (10 mg total) by mouth daily. 07/21/22   Myrna Camelia HERO, NP    Allergies: Patient has no known allergies.    Review of Systems  Updated Vital Signs BP (!) 174/102   Pulse 96   Temp 97.8 F (36.6 C)   Resp 18   SpO2 100%   Physical Exam Vitals and nursing note reviewed.  Constitutional:      General: He is not in acute distress.    Appearance: He is well-developed.  HENT:     Head: Normocephalic and atraumatic.     Right Ear: External ear normal.     Left Ear: External ear normal.     Nose: Nose normal.  Eyes:     Extraocular Movements: Extraocular movements intact.     Conjunctiva/sclera: Conjunctivae normal.     Pupils: Pupils  are equal, round, and reactive to light.  Cardiovascular:     Rate and Rhythm: Normal rate and regular rhythm.     Heart sounds: Normal heart sounds.  Pulmonary:     Effort: Pulmonary effort is normal. No respiratory distress.     Breath sounds: Normal breath sounds.  Musculoskeletal:     Cervical back: Normal range of motion and neck supple.     Right lower leg: No edema.     Left lower leg: No edema.  Skin:    General: Skin is warm and dry.  Neurological:     Mental Status: He is alert and oriented to person, place, and time. Mental status is at baseline.     Comments: MENTAL STATUS: AAOx3 CRANIAL NERVES: II: Pupils equal and reactive 5 mm BL, no RAPD, no VF deficits III, IV, VI: EOM intact, no gaze preference or deviation, no nystagmus. V: normal sensation to light touch in V1, V2, and V3 segments bilaterally VII: no facial weakness or asymmetry, no nasolabial fold flattening VIII: normal hearing to speech and finger friction IX, X: normal palatal elevation, no uvular deviation XI: 5/5 head turn and 5/5 shoulder shrug bilaterally XII: midline tongue protrusion MOTOR: 5/5 strength in R shoulder flexion, elbow flexion and extension, and grip strength. 5/5  strength in L shoulder flexion, elbow flexion and extension, and grip strength.  5/5 strength in R hip and knee flexion, knee extension, ankle plantar and dorsiflexion. 5/5 strength in L hip and knee flexion, knee extension, ankle plantar and dorsiflexion. SENSORY: Normal sensation to light touch in all extremities COORD: Normal finger to nose and heel to shin, no tremor, no dysmetria  Psychiatric:        Mood and Affect: Mood normal.        Behavior: Behavior normal.     (all labs ordered are listed, but only abnormal results are displayed) Labs Reviewed  CBC WITH DIFFERENTIAL/PLATELET - Abnormal; Notable for the following components:      Result Value   WBC 10.6 (*)    Neutro Abs 8.6 (*)    All other components within  normal limits  BASIC METABOLIC PANEL WITH GFR - Abnormal; Notable for the following components:   Potassium 5.3 (*)    Glucose, Bld 101 (*)    BUN 31 (*)    Creatinine, Ser 2.38 (*)    GFR, Estimated 33 (*)    All other components within normal limits  MRSA NEXT GEN BY PCR, NASAL  ETHANOL  CK  RAPID URINE DRUG SCREEN, HOSP PERFORMED  I-STAT CHEM 8, ED    EKG: None  Radiology: CT Head Wo Contrast Result Date: 03/06/2024 EXAM: CT HEAD WITHOUT 03/06/2024 09:55:48 AM TECHNIQUE: CT of the head was performed without the administration of intravenous contrast. Automated exposure control, iterative reconstruction, and/or weight based adjustment of the mA/kV was utilized to reduce the radiation dose to as low as reasonably achievable. COMPARISON: Report of a CT of the head dated 06/14/2018. The actual study is not currently available for comparison. CLINICAL HISTORY: L occipital headache FINDINGS: BRAIN AND VENTRICLES: No acute intracranial hemorrhage. No mass effect or midline shift. No extra-axial fluid collection. No evidence of acute infarct. No hydrocephalus. ORBITS: No acute abnormality. SINUSES AND MASTOIDS: No acute abnormality. SOFT TISSUES AND SKULL: No acute skull fracture. No acute soft tissue abnormality. IMPRESSION: 1. No acute intracranial abnormality. Electronically signed by: Evalene Coho MD 03/06/2024 10:36 AM EST RP Workstation: [id number]     Procedures   Medications Ordered in the ED  clevidipine (CLEVIPREX) infusion 0.5 mg/mL (16 mg/hr Intravenous Infusion Verify 03/06/24 1303)  magnesium sulfate IVPB 2 g 50 mL (2 g Intravenous New Bag/Given 03/06/24 1300)  metoCLOPramide (REGLAN) injection 10 mg (10 mg Intravenous Given 03/06/24 0941)  diphenhydrAMINE (BENADRYL) injection 12.5 mg (12.5 mg Intravenous Given 03/06/24 0941)  hydrALAZINE  (APRESOLINE ) injection 10 mg (10 mg Intravenous Given 03/06/24 1033)  lactated ringers bolus 1,000 mL (0 mLs Intravenous Stopped  03/06/24 1304)  LORazepam  (ATIVAN ) injection 1 mg (1 mg Intravenous Given 03/06/24 1136)  hydrALAZINE  (APRESOLINE ) injection 10 mg (10 mg Intravenous Given 03/06/24 1136)  HYDROmorphone (DILAUDID) injection 1 mg (1 mg Intravenous Given 03/06/24 1215)    Clinical Course as of 03/06/24 1321  Sun Mar 06, 2024  1026 Reassessed. HA resolved but still hypertensive.  [RP]  1247 Discussed with Ronnald Gave from ICU for admission.  [RP]  1316 Discussed with Dr Nichola from neurology who will see the patient in consultation. [RP]    Clinical Course User Index [RP] Yolande Lamar BROCKS, MD                                 Medical Decision Making Amount and/or Complexity of Data  Reviewed Labs: ordered. Radiology: ordered.  Risk Prescription drug management. Decision regarding hospitalization.   Meric Joye is a 48 year old male history of hypertension and cocaine abuse who presents to the emergency department with a headache and hypertension  Initial Ddx:  ICH, hypertensive emergency, hypertensive encephalopathy, press, migraine, cocaine intoxication  MDM/Course:  Patient presents emergency department with headache and hypertension.  This is in the setting of drinking alcohol and using cocaine.  On exam is markedly hypertensive with a blood pressure in the 220s systolic.  Does appear quite uncomfortable from his headache.  No focal neurologic deficits at this point in time suggest a stroke.  Underwent a noncontrast head CT that did not show acute abnormality.  Given the duration of his symptoms if he did have subarachnoid hemorrhage would expect it to show up on this at this point in time.  Was given several doses of hydralazine  but remained persistently hypertensive so was started on Cleviprex and admitted to the ICU due to concerns for hypertensive emergency and possible evolving press.  Upon re-evaluation still was in significant pain and was given additional rounds of pain medicine. Also noted  to have an AKI  This patient presents to the ED for concern of complaints listed in HPI, this involves an extensive number of treatment options, and is a complaint that carries with it a high risk of complications and morbidity. Disposition including potential need for admission considered.   Dispo: ICU  Records reviewed Outpatient Clinic Notes The following labs were independently interpreted: Chemistry and show AKI I independently reviewed the following imaging with scope of interpretation limited to determining acute life threatening conditions related to emergency care: CT Head and agree with the radiologist interpretation with the following exceptions: none I personally reviewed and interpreted cardiac monitoring: normal sinus rhythm  I personally reviewed and interpreted the pt's EKG: see above for interpretation  I have reviewed the patients home medications and made adjustments as needed Consults: Critical care and neurology  Portions of this note were generated with Dragon dictation software. Dictation errors may occur despite best attempts at proofreading.    CRITICAL CARE Performed by: Lamar JAYSON Shan   Total critical care time: 60 minutes  Critical care time was exclusive of separately billable procedures and treating other patients.  Critical care was necessary to treat or prevent imminent or life-threatening deterioration.  Critical care was time spent personally by me on the following activities: development of treatment plan with patient and/or surrogate as well as nursing, discussions with consultants, evaluation of patient's response to treatment, examination of patient, obtaining history from patient or surrogate, ordering and performing treatments and interventions, ordering and review of laboratory studies, ordering and review of radiographic studies, pulse oximetry and re-evaluation of patient's condition.    Final diagnoses:  Hypertensive emergency  Acute  intractable headache, unspecified headache type  AKI (acute kidney injury)  Cocaine use    ED Discharge Orders     None          Shan Lamar JAYSON, MD 03/06/24 1321
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
## 2024-03-07 ENCOUNTER — Other Ambulatory Visit (HOSPITAL_COMMUNITY): Payer: Self-pay

## 2024-03-07 LAB — CBC
HCT: 42.9 % (ref 39.0–52.0)
Hemoglobin: 14.9 g/dL (ref 13.0–17.0)
MCH: 30.1 pg (ref 26.0–34.0)
MCHC: 34.7 g/dL (ref 30.0–36.0)
MCV: 86.7 fL (ref 80.0–100.0)
Platelets: 178 K/uL (ref 150–400)
RBC: 4.95 MIL/uL (ref 4.22–5.81)
RDW: 12.7 % (ref 11.5–15.5)
WBC: 8.4 K/uL (ref 4.0–10.5)
nRBC: 0 % (ref 0.0–0.2)

## 2024-03-07 LAB — BASIC METABOLIC PANEL WITH GFR
Anion gap: 8 (ref 5–15)
BUN: 31 mg/dL — ABNORMAL HIGH (ref 6–20)
CO2: 27 mmol/L (ref 22–32)
Calcium: 9 mg/dL (ref 8.9–10.3)
Chloride: 101 mmol/L (ref 98–111)
Creatinine, Ser: 2.48 mg/dL — ABNORMAL HIGH (ref 0.61–1.24)
GFR, Estimated: 31 mL/min — ABNORMAL LOW (ref >60)
Glucose, Bld: 96 mg/dL (ref 70–99)
Potassium: 4.5 mmol/L (ref 3.5–5.1)
Sodium: 136 mmol/L (ref 135–145)

## 2024-03-07 LAB — HEPATIC FUNCTION PANEL
ALT: 15 U/L (ref 0–44)
AST: 21 U/L (ref 15–41)
Albumin: 3.3 g/dL — ABNORMAL LOW (ref 3.5–5.0)
Alkaline Phosphatase: 67 U/L (ref 38–126)
Bilirubin, Direct: 0.1 mg/dL (ref 0.0–0.2)
Indirect Bilirubin: 1.1 mg/dL — ABNORMAL HIGH (ref 0.3–0.9)
Total Bilirubin: 1.2 mg/dL (ref 0.0–1.2)
Total Protein: 6.7 g/dL (ref 6.5–8.1)

## 2024-03-07 LAB — PROTIME-INR
INR: 1 (ref 0.8–1.2)
Prothrombin Time: 13.9 s (ref 11.4–15.2)

## 2024-03-07 LAB — PROCALCITONIN: Procalcitonin: <0.1 ng/mL

## 2024-03-07 LAB — TROPONIN I (HIGH SENSITIVITY): Troponin I (High Sensitivity): 28 ng/L — ABNORMAL HIGH (ref <18)

## 2024-03-07 LAB — LACTIC ACID, PLASMA: Lactic Acid, Venous: 0.7 mmol/L (ref 0.5–1.9)

## 2024-03-07 MED ORDER — HYDRALAZINE HCL 50 MG PO TABS
100.0000 mg | ORAL_TABLET | Freq: Three times a day (TID) | ORAL | Status: DC
  Administered 2024-03-07: 100 mg via ORAL
  Filled 2024-03-07: qty 2

## 2024-03-07 MED ORDER — HYDRALAZINE HCL 50 MG PO TABS
50.0000 mg | ORAL_TABLET | Freq: Three times a day (TID) | ORAL | Status: DC
  Administered 2024-03-07 (×2): 50 mg via ORAL
  Filled 2024-03-07 (×2): qty 1

## 2024-03-07 MED ORDER — HYDRALAZINE HCL 50 MG PO TABS: 100.0000 mg | ORAL_TABLET | Freq: Three times a day (TID) | ORAL | Status: DC

## 2024-03-07 MED ORDER — ACETAMINOPHEN 325 MG PO TABS
650.0000 mg | ORAL_TABLET | Freq: Four times a day (QID) | ORAL | Status: DC | PRN
  Administered 2024-03-07 – 2024-03-09 (×2): 650 mg via ORAL
  Filled 2024-03-07 (×2): qty 2

## 2024-03-07 MED ORDER — HYDRALAZINE HCL 50 MG PO TABS
50.0000 mg | ORAL_TABLET | Freq: Three times a day (TID) | ORAL | 0 refills | Status: DC
  Filled 2024-03-07: qty 90, 30d supply, fill #0

## 2024-03-07 MED ORDER — AMLODIPINE BESYLATE 10 MG PO TABS
10.0000 mg | ORAL_TABLET | Freq: Every day | ORAL | 0 refills | Status: DC
  Filled 2024-03-07: qty 30, 30d supply, fill #0
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
## 2024-03-07 NOTE — Progress Notes (Signed)
  [facility] [employer] Pharmacies please call [phone] for claim processing assistance.  Rx BIN: [id number] Rx Group: [id number] Rx PCN: [id number] Relationship Code: 1 Person Code: 01  Patient ID (MRN): [id number]    Patient Name: Marc Mata   Patient DOB 1975/06/11   Discharge Date:03/07/2024  Expiration Date:03/14/2024 (must be filled within 7 days of discharge)

## 2024-03-07 NOTE — Plan of Care (Signed)
  Problem: Education: Goal: Knowledge of General Education information will improve Description: Including pain rating scale, medication(s)/side effects and non-pharmacologic comfort measures Outcome: Progressing   Problem: Health Behavior/Discharge Planning: Goal: Ability to manage health-related needs will improve Outcome: Progressing   Problem: Clinical Measurements: Goal: Ability to maintain clinical measurements within normal limits will improve Outcome: Progressing Goal: Diagnostic test results will improve Outcome: Progressing Goal: Respiratory complications will improve Outcome: Progressing Goal: Cardiovascular complication will be avoided Outcome: Progressing   Problem: Activity: Goal: Risk for activity intolerance will decrease Outcome: Progressing   Problem: Nutrition: Goal: Adequate nutrition will be maintained Outcome: Progressing   Problem: Coping: Goal: Level of anxiety will decrease Outcome: Progressing   Problem: Pain Managment: Goal: General experience of comfort will improve and/or be controlled Outcome: Progressing   Problem: Safety: Goal: Ability to remain free from injury will improve Outcome: Progressing   Problem: Skin Integrity: Goal: Risk for impaired skin integrity will decrease Outcome: Progressing   Problem: Elimination: Goal: Will not experience complications related to bowel motility Outcome: Not Progressing Goal: Will not experience complications related to urinary retention Outcome: Not Progressing
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
## 2024-03-08 LAB — CBC
HCT: 46.5 % (ref 39.0–52.0)
Hemoglobin: 16.2 g/dL (ref 13.0–17.0)
MCH: 29.8 pg (ref 26.0–34.0)
MCHC: 34.8 g/dL (ref 30.0–36.0)
MCV: 85.5 fL (ref 80.0–100.0)
Platelets: 178 K/uL (ref 150–400)
RBC: 5.44 MIL/uL (ref 4.22–5.81)
RDW: 12.7 % (ref 11.5–15.5)
WBC: 8.8 K/uL (ref 4.0–10.5)
nRBC: 0 % (ref 0.0–0.2)

## 2024-03-08 LAB — RENAL FUNCTION PANEL
Albumin: 3.6 g/dL (ref 3.5–5.0)
Anion gap: 11 (ref 5–15)
BUN: 28 mg/dL — ABNORMAL HIGH (ref 6–20)
CO2: 25 mmol/L (ref 22–32)
Calcium: 9.3 mg/dL (ref 8.9–10.3)
Chloride: 101 mmol/L (ref 98–111)
Creatinine, Ser: 2.44 mg/dL — ABNORMAL HIGH (ref 0.61–1.24)
GFR, Estimated: 32 mL/min — ABNORMAL LOW (ref >60)
Glucose, Bld: 106 mg/dL — ABNORMAL HIGH (ref 70–99)
Phosphorus: 4.5 mg/dL (ref 2.5–4.6)
Potassium: 4.7 mmol/L (ref 3.5–5.1)
Sodium: 137 mmol/L (ref 135–145)

## 2024-03-08 LAB — TRIGLYCERIDES: Triglycerides: 138 mg/dL (ref <150)

## 2024-03-08 MED ORDER — CLONIDINE HCL 0.2 MG PO TABS
0.1000 mg | ORAL_TABLET | Freq: Three times a day (TID) | ORAL | Status: DC | PRN
  Administered 2024-03-08: 0.1 mg via ORAL
  Filled 2024-03-08: qty 1

## 2024-03-08 MED ORDER — HYDRALAZINE HCL 50 MG PO TABS
100.0000 mg | ORAL_TABLET | Freq: Three times a day (TID) | ORAL | Status: DC
  Administered 2024-03-08 – 2024-03-09 (×5): 100 mg via ORAL
  Filled 2024-03-08 (×5): qty 2

## 2024-03-08 MED ORDER — CLONIDINE HCL 0.2 MG PO TABS
0.1000 mg | ORAL_TABLET | Freq: Once | ORAL | Status: AC
  Administered 2024-03-08: 0.1 mg via ORAL
  Filled 2024-03-08: qty 1

## 2024-03-08 MED ORDER — CLONIDINE HCL 0.2 MG PO TABS: 0.2000 mg | ORAL_TABLET | Freq: Three times a day (TID) | ORAL | Status: DC | PRN
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
## 2024-03-08 NOTE — Plan of Care (Signed)

## 2024-03-09 ENCOUNTER — Other Ambulatory Visit (HOSPITAL_COMMUNITY): Payer: Self-pay

## 2024-03-09 LAB — RENAL FUNCTION PANEL
Albumin: 3.4 g/dL — ABNORMAL LOW (ref 3.5–5.0)
Anion gap: 11 (ref 5–15)
BUN: 33 mg/dL — ABNORMAL HIGH (ref 6–20)
CO2: 27 mmol/L (ref 22–32)
Calcium: 9.3 mg/dL (ref 8.9–10.3)
Chloride: 97 mmol/L — ABNORMAL LOW (ref 98–111)
Creatinine, Ser: 2.51 mg/dL — ABNORMAL HIGH (ref 0.61–1.24)
GFR, Estimated: 31 mL/min — ABNORMAL LOW (ref >60)
Glucose, Bld: 100 mg/dL — ABNORMAL HIGH (ref 70–99)
Phosphorus: 3.9 mg/dL (ref 2.5–4.6)
Potassium: 4.6 mmol/L (ref 3.5–5.1)
Sodium: 135 mmol/L (ref 135–145)

## 2024-03-09 LAB — CBC
HCT: 44.8 % (ref 39.0–52.0)
Hemoglobin: 15.3 g/dL (ref 13.0–17.0)
MCH: 29.3 pg (ref 26.0–34.0)
MCHC: 34.2 g/dL (ref 30.0–36.0)
MCV: 85.7 fL (ref 80.0–100.0)
Platelets: 169 K/uL (ref 150–400)
RBC: 5.23 MIL/uL (ref 4.22–5.81)
RDW: 12.5 % (ref 11.5–15.5)
WBC: 6.2 K/uL (ref 4.0–10.5)
nRBC: 0 % (ref 0.0–0.2)

## 2024-03-09 MED ORDER — AMLODIPINE BESYLATE 10 MG PO TABS
10.0000 mg | ORAL_TABLET | Freq: Every day | ORAL | 0 refills | Status: DC
  Filled 2024-03-09: qty 30, 30d supply, fill #0

## 2024-03-09 MED ORDER — HYDRALAZINE HCL 100 MG PO TABS
100.0000 mg | ORAL_TABLET | Freq: Three times a day (TID) | ORAL | 0 refills | Status: DC
  Filled 2024-03-09: qty 270, 90d supply, fill #0
  Filled 2024-03-09: qty 90, 30d supply, fill #0

## 2024-03-09 NOTE — Discharge Instructions (Signed)
Follow with PCP as scheduled  Please get a complete blood count and chemistry panel checked by your Primary MD at your next visit, and again as instructed by your Primary MD. Please get your medications reviewed and adjusted by your Primary MD.  Please request your Primary MD to go over all Hospital Tests and Procedure/Radiological results at the follow up, please get all Hospital records sent to your Prim MD by signing hospital release before you go home.  In some cases, there will be blood work, cultures and biopsy results pending at the time of your discharge. Please request that your primary care M.D. goes through all the records of your hospital data and follows up on these results.  If you had Pneumonia of Lung problems at the Hospital: Please get a 2 view Chest X ray done in 6-8 weeks after hospital discharge or sooner if instructed by your Primary MD.  If you have Congestive Heart Failure: Please call your Cardiologist or Primary MD anytime you have any of the following symptoms:  1) 3 pound weight gain in 24 hours or 5 pounds in 1 week  2) shortness of breath, with or without a dry hacking cough  3) swelling in the hands, feet or stomach  4) if you have to sleep on extra pillows at night in order to breathe  Follow cardiac low salt diet and 1.5 lit/day fluid restriction.  If you have diabetes Accuchecks 4 times/day, Once in AM empty stomach and then before each meal. Log in all results and show them to your primary doctor at your next visit. If any glucose reading is under 80 or above 300 call your primary MD immediately.  If you have Seizure/Convulsions/Epilepsy: Please do not drive, operate heavy machinery, participate in activities at heights or participate in high speed sports until you have seen by Primary MD or a Neurologist and advised to do so again. Per [facility] statutes, patients with seizures are not allowed to drive until they have been seizure-free for six  months.  Use caution when using heavy equipment or power tools. Avoid working on ladders or at heights. Take showers instead of baths. Ensure the water temperature is not too high on the home water heater. Do not go swimming alone. Do not lock yourself in a room alone (i.e. bathroom). When caring for infants or small children, sit down when holding, feeding, or changing them to minimize risk of injury to the child in the event you have a seizure. Maintain good sleep hygiene. Avoid alcohol.   If you had Gastrointestinal Bleeding: Please ask your Primary MD to check a complete blood count within one week of discharge or at your next visit. Your endoscopic/colonoscopic biopsies that are pending at the time of discharge, will also need to followed by your Primary MD.  Get Medicines reviewed and adjusted. Please take all your medications with you for your next visit with your Primary MD  Please request your Primary MD to go over all hospital tests and procedure/radiological results at the follow up, please ask your Primary MD to get all Hospital records sent to his/her office.  If you experience worsening of your admission symptoms, develop shortness of breath, life threatening emergency, suicidal or homicidal thoughts you must seek medical attention immediately by calling 911 or calling your MD immediately  if symptoms less severe.  You must read complete instructions/literature along with all the possible adverse reactions/side effects for all the Medicines you take and that have  been prescribed to you. Take any new Medicines after you have completely understood and accpet all the possible adverse reactions/side effects.   Do not drive or operate heavy machinery when taking Pain medications.   Do not take more than prescribed Pain, Sleep and Anxiety Medications  Special Instructions: If you have smoked or chewed Tobacco  in the last 2 yrs please stop smoking, stop any regular Alcohol  and or any  Recreational drug use.  Wear Seat belts while driving.  Please note You were cared for by a hospitalist during your hospital stay. If you have any questions about your discharge medications or the care you received while you were in the hospital after you are discharged, you can call the unit and asked to speak with the hospitalist on call if the hospitalist that took care of you is not available. Once you are discharged, your primary care physician will handle any further medical issues. Please note that NO REFILLS for any discharge medications will be authorized once you are discharged, as it is imperative that you return to your primary care physician (or establish a relationship with a primary care physician if you do not have one) for your aftercare needs so that they can reassess your need for medications and monitor your lab values.  You can reach the hospitalist office at phone [phone] or fax [phone]   If you do not have a primary care physician, you can call [phone] for a physician referral.  Activity: As tolerated with Full fall precautions use walker/cane & assistance as needed    Diet: heart healthy  Disposition Home

## 2024-03-09 NOTE — Plan of Care (Signed)
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
## 2024-03-09 NOTE — TOC Transition Note (Signed)
 Transition of Care [facility]) - Discharge Note   Patient Details  Name: Marc Mata MRN: [id number] Date of Birth: [birth year]  Transition of Care [facility]) CM/SW Contact:  Roxie KANDICE Stain, RN Phone Number: 03/09/2024, 1:06 PM   Clinical Narrative:    Patient stable for discharge,  MATCH letter sent to pharmacy. PCP apt on AVS.     Final next level of care: Home/Self Care Barriers to Discharge: Barriers Resolved   Patient Goals and CMS Choice Patient states their goals for this hospitalization and ongoing recovery are:: wants to discharge so he can go back to work          Discharge Placement                   home    Discharge Plan and Services Additional resources added to the After Visit Summary for   In-house Referral: Artist, PCP / [facility] Discharge Planning Services: Follow-up appt scheduled                                 Social Drivers of Health (SDOH) Interventions SDOH Screenings   Depression (PHQ2-9): Low Risk  (08/23/2019)  Tobacco Use: High Risk (07/21/2022)     Readmission Risk Interventions    03/09/2024    1:06 PM  Readmission Risk Prevention Plan  Post Dischage Appt Complete  Medication Screening Complete  Transportation Screening Complete

## 2024-03-14 ENCOUNTER — Encounter: Payer: Self-pay | Admitting: Medical

## 2024-03-14 ENCOUNTER — Ambulatory Visit: Payer: PRIVATE HEALTH INSURANCE | Admitting: Medical

## 2024-03-14 VITALS — BP 118/72 | HR 79 | Ht 69.5 in | Wt 152.0 lb

## 2024-03-14 DIAGNOSIS — N1831 Chronic kidney disease, stage 3a: Secondary | ICD-10-CM

## 2024-03-14 DIAGNOSIS — I10 Essential (primary) hypertension: Secondary | ICD-10-CM

## 2024-03-14 DIAGNOSIS — Z23 Encounter for immunization: Secondary | ICD-10-CM | POA: Diagnosis not present

## 2024-03-14 DIAGNOSIS — Z136 Encounter for screening for cardiovascular disorders: Secondary | ICD-10-CM

## 2024-03-14 DIAGNOSIS — B360 Pityriasis versicolor: Secondary | ICD-10-CM

## 2024-03-14 DIAGNOSIS — N179 Acute kidney failure, unspecified: Secondary | ICD-10-CM

## 2024-03-14 DIAGNOSIS — R7301 Impaired fasting glucose: Secondary | ICD-10-CM

## 2024-03-14 DIAGNOSIS — Z13220 Encounter for screening for lipoid disorders: Secondary | ICD-10-CM | POA: Diagnosis not present

## 2024-03-14 MED ORDER — KETOCONAZOLE 2 % EX SHAM: 1.0000 | MEDICATED_SHAMPOO | CUTANEOUS | 0 refills | Status: AC

## 2024-03-14 MED ORDER — FLUCONAZOLE 100 MG PO TABS: 100.0000 mg | ORAL_TABLET | Freq: Every day | ORAL | 0 refills | Status: DC

## 2024-03-14 NOTE — Patient Instructions (Signed)
 Hospital Follow Up Blood pressure well-controlled with current medications. Cocaine use poses cardiovascular risks. - Continue amlodipine  and hydralazine . - Encouraged adherence to medication regimen. - Advised on lifestyle modifications including diet and exercise.  Essential hypertension with chronic kidney disease stage 3a and recent acute kidney injury Chronic kidney disease stage 3a with recent acute kidney injury likely exacerbated by uncontrolled hypertension. Emphasized importance of blood pressure control to prevent further kidney damage. - Continue amlodipine  and hydralazine . - Encouraged adequate hydration with 7-8 glasses of water daily. - Advised against NSAIDs to protect kidney function.  Avoid ibuprofen , aleve, motrin , BC powders, etc. - Recheck kidney function in 2-3 weeks to assess GFR. - Consider referral to nephrology if GFR remains low. - Encouraged lifestyle modifications including diet and exercise.  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 Previous treatment effective, current presentation suggests recurrence. - Prescribed Diflucan  100 mg daily for 5 days. - Recommended Nizoral  shampoo to affected areas once or twice a week, leaving on for 10 minutes before rinsing. - Advised on dietary modifications to reduce greasy and sugary foods.  Cocaine abuse, uncomplicated Recent positive drug screen for cocaine. Discussed significant cardiovascular risks associated with cocaine use. - Advised continued abstinence from cocaine use. - Discussed potential cardiovascular risks and benefits of cessation.  Impaired fasting glucose General advice on diet and lifestyle modifications provided. - Encouraged dietary modifications to reduce sugar intake.  Counseled on the influenza virus vaccine.   Influenza vaccine given after consent obtained.
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
## 2024-03-15 ENCOUNTER — Telehealth: Payer: Self-pay | Admitting: Internal Medicine

## 2024-03-15 NOTE — Telephone Encounter (Signed)
 Copied from CRM [id number]. Topic: Clinical - Medication Question >> Mar 15, 2024  9:42 AM Everette C wrote: Reason for CRM: The patient has called to request contact with a member of clinical staff when possible to discuss cost effective alternatives of their prescriptions for ketoconazole  (NIZORAL ) 2 % shampoo [[id number]] and fluconazole  (DIFLUCAN ) 100 MG tablet [[id number]]  The patient shares even with use of their their cost savings pharmacy card their prescriptions come to a total of $41.09 which is difficult for them currently. Please contact if/when possible

## 2024-03-15 NOTE — Telephone Encounter (Signed)
 Pt was notified.

## 2024-03-21 ENCOUNTER — Ambulatory Visit: Payer: Self-pay | Admitting: Family Medicine

## 2024-03-28 ENCOUNTER — Other Ambulatory Visit: Payer: PRIVATE HEALTH INSURANCE

## 2024-03-28 DIAGNOSIS — I10 Essential (primary) hypertension: Secondary | ICD-10-CM

## 2024-03-28 DIAGNOSIS — R7301 Impaired fasting glucose: Secondary | ICD-10-CM

## 2024-03-28 DIAGNOSIS — Z13220 Encounter for screening for lipoid disorders: Secondary | ICD-10-CM

## 2024-03-29 ENCOUNTER — Ambulatory Visit: Payer: Self-pay | Admitting: Medical

## 2024-03-29 ENCOUNTER — Other Ambulatory Visit: Payer: Self-pay | Admitting: Medical

## 2024-03-29 DIAGNOSIS — E7800 Pure hypercholesterolemia, unspecified: Secondary | ICD-10-CM

## 2024-03-29 DIAGNOSIS — I10 Essential (primary) hypertension: Secondary | ICD-10-CM

## 2024-03-29 DIAGNOSIS — R809 Proteinuria, unspecified: Secondary | ICD-10-CM

## 2024-03-29 DIAGNOSIS — R944 Abnormal results of kidney function studies: Secondary | ICD-10-CM

## 2024-03-29 LAB — LIPID PANEL
Chol/HDL Ratio: 3 ratio (ref 0.0–5.0)
Cholesterol, Total: 167 mg/dL (ref 100–199)
HDL: 55 mg/dL
LDL Chol Calc (NIH): 93 mg/dL (ref 0–99)
Triglycerides: 105 mg/dL (ref 0–149)
VLDL Cholesterol Cal: 19 mg/dL (ref 5–40)

## 2024-03-29 LAB — BASIC METABOLIC PANEL WITH GFR
BUN/Creatinine Ratio: 17 (ref 9–20)
BUN: 39 mg/dL — ABNORMAL HIGH (ref 6–24)
CO2: 21 mmol/L (ref 20–29)
Calcium: 9.8 mg/dL (ref 8.7–10.2)
Chloride: 101 mmol/L (ref 96–106)
Creatinine, Ser: 2.24 mg/dL — ABNORMAL HIGH (ref 0.76–1.27)
Glucose: 83 mg/dL (ref 70–99)
Potassium: 4.5 mmol/L (ref 3.5–5.2)
Sodium: 136 mmol/L (ref 134–144)
eGFR: 35 mL/min/1.73 — ABNORMAL LOW

## 2024-03-29 LAB — HEMOGLOBIN A1C
Est. average glucose Bld gHb Est-mCnc: 111 mg/dL
Hgb A1c MFr Bld: 5.5 % (ref 4.8–5.6)

## 2024-03-29 NOTE — Progress Notes (Signed)
 See if lab can add a hepatitis B surface antigen, due to abnormal kidney function  Results:  Labs show diabetes marker normal, cholesterol numbers look pretty good but kidney marker is about the same as it was the last couple blood test, abnormal..  Electrolytes okay.  I am placing a new referral to kidney specialist  Continue the same medicines as your recent visit with me here in follow-up in about a month

## 2024-04-04 ENCOUNTER — Encounter: Payer: Self-pay | Admitting: Medical

## 2024-04-04 DIAGNOSIS — R7989 Other specified abnormal findings of blood chemistry: Secondary | ICD-10-CM | POA: Insufficient documentation

## 2024-04-04 DIAGNOSIS — F1911 Other psychoactive substance abuse, in remission: Secondary | ICD-10-CM | POA: Insufficient documentation

## 2024-04-08 ENCOUNTER — Telehealth: Payer: Self-pay | Admitting: Medical

## 2024-04-08 ENCOUNTER — Other Ambulatory Visit (HOSPITAL_COMMUNITY): Payer: Self-pay

## 2024-04-08 DIAGNOSIS — I10 Essential (primary) hypertension: Secondary | ICD-10-CM

## 2024-04-08 MED ORDER — AMLODIPINE BESYLATE 10 MG PO TABS
10.0000 mg | ORAL_TABLET | Freq: Every day | ORAL | 0 refills | Status: DC
  Filled 2024-04-08: qty 90, 90d supply, fill #0

## 2024-04-08 MED ORDER — FLUCONAZOLE 100 MG PO TABS
100.0000 mg | ORAL_TABLET | Freq: Every day | ORAL | 0 refills | Status: DC
  Filled 2024-04-08: qty 10, 10d supply, fill #0

## 2024-04-08 NOTE — Telephone Encounter (Signed)
 Called [facility] first to check on cost on Amlodipine  but it hasn't been filled there so unable to.  I called pt and he was a little confused on his meds, went over meds, he said ran out of Amlodipine  and Fluconazole , said skin problem did get some better just not resolved so he would like refill on that also both to [facility].   Told pt to call if meds are not affordable

## 2024-04-08 NOTE — Telephone Encounter (Signed)
 Copied from CRM #[id number]. Topic: Clinical - Prescription Issue >> Apr 08, 2024  9:59 AM Wess RAMAN wrote: Reason for CRM: Patient needs assistance getting amLODipine  (NORVASC ) 10 MG tablet. He states it is too expensive.   Callback #: [id number]  Pharmacy: [facility] [phone] - [place], [place] - [address] AT [facility] [address] [place] [address] Phone: [phone] Fax: [phone] Hours: Not open 24 hours

## 2024-04-11 ENCOUNTER — Telehealth: Payer: Self-pay | Admitting: Medical

## 2024-04-11 NOTE — Telephone Encounter (Signed)
 Pt stopped by office & said Rx were not at pharmacy, they were sent to wrong pharmacy, I called them into [facility]

## 2024-04-13 ENCOUNTER — Telehealth: Payer: Self-pay | Admitting: Medical

## 2024-04-13 NOTE — Telephone Encounter (Signed)
 Called [facility] and both Amlodipine  $63.29 Fluconazole  $ 26 going thru ins but his pharmacy benefits is only like a discount card.  Checked [facility] & [facility] has best prices $16.37 Fluconazole  & $7.53 for #30 amlodipine  & $11.59 for #90.  Called pt no answer

## 2024-04-13 NOTE — Telephone Encounter (Signed)
 Amlodipine  is too expensive for patient. I know you have been working on this for pt.    Copied from CRM #[id number]. Topic: Clinical - Prescription Issue >> Apr 08, 2024  9:59 AM Wess RAMAN wrote: Reason for CRM: Patient needs assistance getting amLODipine  (NORVASC ) 10 MG tablet. He states it is too expensive.   Callback #: [id number]  Pharmacy: [facility] [phone] - [place], [place] - [address] AT [facility] OF [address] [place] [address] Phone: [phone] Fax: [phone] Hours: Not open 24 hours >> Apr 13, 2024 10:03 AM Rea C wrote: Patient stated he was told to call back to clinic in regards to prescription amount for the amlodipine . The pharmacy told him it is $63.   Patients contact is [phone] (M).

## 2024-04-14 ENCOUNTER — Telehealth: Payer: Self-pay | Admitting: Medical

## 2024-04-14 DIAGNOSIS — I10 Essential (primary) hypertension: Secondary | ICD-10-CM

## 2024-04-14 MED ORDER — FLUCONAZOLE 100 MG PO TABS: 100.0000 mg | ORAL_TABLET | Freq: Every day | ORAL | 0 refills | Status: AC

## 2024-04-14 MED ORDER — AMLODIPINE BESYLATE 10 MG PO TABS: 10.0000 mg | ORAL_TABLET | Freq: Every day | ORAL | 0 refills | Status: AC

## 2024-04-14 NOTE — Telephone Encounter (Signed)
 [facility]ent to [facility]  Copied from [facility] [id number]. Topic: Clinical - Prescription Issue >> Apr 14, 2024  9:27 AM Marc Mata wrote: Reason for CRM: Patient called in stated he called the pharmacy and they stated that it is not there needs it to be sent to  amLODipine  (NORVASC ) 10 MG tablet fluconazole  (DIFLUCAN ) 100 MG tablet  [facility] [id number] [name], [name] - [address] [place] [address] Phone: [phone] Fax: [phone]  Would like a callback once that has been sent

## 2024-04-14 NOTE — Telephone Encounter (Signed)
 Copied from CRM [id number]. Topic: Clinical - Medication Refill >> Apr 14, 2024 12:21 PM Darshell M wrote: Medication: fluconazole  (DIFLUCAN ) 100 MG tablet  amLODipine  (NORVASC ) 10 MG tablet      Has the patient contacted their pharmacy? Yes (Agent: If no, request that the patient contact the pharmacy for the refill. If patient does not wish to contact the pharmacy document the reason why and proceed with request.) (Agent: If yes, when and what did the pharmacy advise?)  This is the patient's preferred pharmacy:   [facility] [id number] [name], [place] - [address] [place] [address] Phone: [phone] Fax: [phone]  Is this the correct pharmacy for this prescription? Yes If no, delete pharmacy and type the correct one.   Has the prescription been filled recently? No  Is the patient out of the medication? Yes  Has the patient been seen for an appointment in the last year OR does the patient have an upcoming appointment? Yes  Can we respond through MyChart? No  Agent: Please be advised that Rx refills may take up to 3 business days. We ask that you follow-up with your pharmacy.

## 2024-04-15 NOTE — Telephone Encounter (Signed)
 Pt picked up Rx from [facility]

## 2024-04-17 ENCOUNTER — Other Ambulatory Visit: Payer: Self-pay

## 2024-04-17 ENCOUNTER — Emergency Department (HOSPITAL_COMMUNITY)
Admission: EM | Admit: 2024-04-17 | Discharge: 2024-04-17 | Disposition: A | Discharge status: Home / Self Care | Payer: PRIVATE HEALTH INSURANCE | Attending: Emergency Medicine | Admitting: Emergency Medicine

## 2024-04-17 ENCOUNTER — Encounter (HOSPITAL_COMMUNITY): Payer: Self-pay

## 2024-04-17 DIAGNOSIS — K0889 Other specified disorders of teeth and supporting structures: Secondary | ICD-10-CM | POA: Diagnosis present

## 2024-04-17 DIAGNOSIS — Z79899 Other long term (current) drug therapy: Secondary | ICD-10-CM | POA: Diagnosis not present

## 2024-04-17 DIAGNOSIS — I10 Essential (primary) hypertension: Secondary | ICD-10-CM | POA: Insufficient documentation

## 2024-04-17 DIAGNOSIS — K047 Periapical abscess without sinus: Secondary | ICD-10-CM | POA: Diagnosis not present

## 2024-04-17 MED ORDER — AMOXICILLIN-POT CLAVULANATE 875-125 MG PO TABS: 1.0000 | ORAL_TABLET | Freq: Two times a day (BID) | ORAL | 0 refills | Status: AC

## 2024-04-17 MED ORDER — AMOXICILLIN-POT CLAVULANATE 875-125 MG PO TABS
1.0000 | ORAL_TABLET | Freq: Once | ORAL | Status: AC
  Administered 2024-04-17: 1 via ORAL
  Filled 2024-04-17: qty 1

## 2024-04-17 MED ORDER — ACETAMINOPHEN 500 MG PO TABS
1000.0000 mg | ORAL_TABLET | Freq: Once | ORAL | Status: AC
  Administered 2024-04-17: 1000 mg via ORAL
  Filled 2024-04-17: qty 2

## 2024-04-17 NOTE — ED Notes (Signed)
 Pt unsure if he has had fevers. He states he placed an ice pack on his jaw last night and his jaw was more swollen by this morning. Pt states he has a dentist appt Monday

## 2024-04-17 NOTE — ED Triage Notes (Signed)
 Left right dental pain with swelling to cheek.  Reports tooth is also loose.

## 2024-04-17 NOTE — Discharge Instructions (Signed)
 It is important that you follow-up with your dentist tomorrow.  Failure to follow-up with dentist can result in worsening or reoccurrence of symptoms.  Seek emergency care if symptoms are worsening over the next 48 hours.  Alternating between 650 mg Tylenol  and 400 mg Advil : The best way to alternate taking Acetaminophen  (example Tylenol ) and Ibuprofen  (example Advil /Motrin ) is to take them 3 hours apart. For example, if you take ibuprofen  at 6 am you can then take Tylenol  at 9 am. You can continue this regimen throughout the day, making sure you do not exceed the recommended maximum dose for each drug.

## 2024-04-17 NOTE — ED Provider Notes (Signed)
 " [place] EMERGENCY DEPARTMENT AT [facility] Provider Note   CSN: [id number] Arrival date & time: 04/17/24  9296     Patient presents with: Oral Swelling   Marc Mata is a 49 y.o. male with PMHx HTN, HLD who presents to ED concerned for tooth pain and swelling since waking up this morning. Patient stating that this left lower premolar tooth has been intermittently hurting for a while. Patient applied a cold pack to tooth last night and woke up this morning and noticed the swelling around his tooth. Patient also stating that the tooth feels loose. Patient stating that he has an appointment with dentist tomorrow to address the painful tooth.   Denies any other acute symptoms today such as chest pain, fever, vomiting.    HPI     Prior to Admission medications  Medication Sig Start Date End Date Taking? Authorizing Provider  amoxicillin -clavulanate (AUGMENTIN ) 875-125 MG tablet Take 1 tablet by mouth every 12 (twelve) hours. 04/17/24  Yes Hoy Fraction F, PA-C  amLODipine  (NORVASC ) 10 MG tablet Take 1 tablet (10 mg total) by mouth daily. 04/14/24   Tysinger, Alm RAMAN, PA-C  fluconazole  (DIFLUCAN ) 100 MG tablet Take 1 tablet (100 mg total) by mouth daily. 04/14/24   Tysinger, Alm RAMAN, PA-C  hydrALAZINE  (APRESOLINE ) 100 MG tablet Take 1 tablet (100 mg total) by mouth 3 (three) times daily. 03/09/24 06/07/24  Gherghe, Costin M, MD  ketoconazole  (NIZORAL ) 2 % shampoo Apply 1 Application topically 2 (two) times a week. 03/14/24   Tysinger, Alm RAMAN, PA-C    Allergies: Patient has no known allergies.    Review of Systems  HENT:  Positive for dental problem.     Updated Vital Signs BP (!) 174/110   Pulse 94   Temp 98.2 F (36.8 C) (Oral)   Resp 18   Ht 5' 9.5 (1.765 m)   Wt 68.9 kg   SpO2 100%   BMI 22.11 kg/m   Physical Exam Vitals and nursing note reviewed.  Constitutional:      General: He is not in acute distress.    Appearance: He is not ill-appearing or  toxic-appearing.  HENT:     Head: Normocephalic and atraumatic.     Mouth/Throat:     Comments: Mild to borderline moderate swelling on buccal surface of left lower premolar gums. No other oropharyngeal lesion or swelling appreciated. Eyes:     General: No scleral icterus.       Right eye: No discharge.        Left eye: No discharge.     Conjunctiva/sclera: Conjunctivae normal.  Cardiovascular:     Rate and Rhythm: Normal rate.  Pulmonary:     Effort: Pulmonary effort is normal.  Abdominal:     General: Abdomen is flat.  Skin:    General: Skin is warm and dry.  Neurological:     General: No focal deficit present.     Mental Status: He is alert and oriented to person, place, and time. Mental status is at baseline.  Psychiatric:        Mood and Affect: Mood normal.        Behavior: Behavior normal.     (all labs ordered are listed, but only abnormal results are displayed) Labs Reviewed - No data to display  EKG: None  Radiology: No results found.   Procedures   Medications Ordered in the ED  amoxicillin -clavulanate (AUGMENTIN ) 875-125 MG per tablet 1 tablet (1 tablet Oral Given  04/17/24 0845)  acetaminophen  (TYLENOL ) tablet 1,000 mg (1,000 mg Oral Given 04/17/24 0845)                                    Medical Decision Making   This patient presents to the ED for concern of dental pain/abscess, this involves an extensive number of treatment options, and is a complaint that carries with it a high risk of complications and morbidity.  The differential diagnosis includes deep space infection   Co morbidities that complicate the patient evaluation  HTN, HLD    Additional history obtained:  PCP Dr. Bulah   Problem List / ED Course / Critical interventions / Medication management  Patient presents to ED concerned for dental abscess.  Physical exam showing mild to borderline moderate swelling in buccal surface of left lower premolar gum area. Rest of physical  exam reassuring. Patient afebrile with stable vitals. Patient able to tolerate PO. Patient with dentist appointment already scheduled for tomorrow.  Provided patient with a dose of Augmentin  in ED and with discharge with a 7 day course of Augmentin . Educated on alternating Advil  and Tylenol  for pain management. Educated patient that symptoms will return and may become more complicated if dentist follow up is not obtained. Patient verbally endorsed understanding of plan. I have reviewed the patients home medicines and have made adjustments as needed The patient has been appropriately medically screened and/or stabilized in the ED. I have low suspicion for any other emergent medical condition which would require further screening, evaluation or treatment in the ED or require inpatient management. At time of discharge the patient is hemodynamically stable and in no acute distress. I have discussed work-up results and diagnosis with patient and answered all questions. Patient is agreeable with discharge plan. We discussed strict return precautions for returning to the emergency department and they verbalized understanding.    Social Determinants of Health:  none       Final diagnoses:  Dental infection    ED Discharge Orders          Ordered    amoxicillin -clavulanate (AUGMENTIN ) 875-125 MG tablet  Every 12 hours        04/17/24 0841               Hoy Nidia FALCON, PA-C 04/17/24 0914  "

## 2024-04-19 NOTE — Telephone Encounter (Signed)
 Pt informed & meds were switched to [facility]

## 2024-04-20 LAB — HEPATITIS B SURFACE ANTIGEN (HBSAG) SCREEN, QUALITATIVE WITH REFLEX TO HEPATITIS D VIRUS (HDV) ANTIBODY: Hepatitis B Surface Ag: NEGATIVE

## 2024-04-20 LAB — SPECIMEN STATUS REPORT

## 2024-04-25 ENCOUNTER — Other Ambulatory Visit: Payer: Self-pay | Admitting: Medical

## 2024-04-25 ENCOUNTER — Telehealth: Payer: Self-pay | Admitting: Internal Medicine

## 2024-04-25 NOTE — Telephone Encounter (Signed)
" ° °  This was given on 03/09/2024 for 3 month supply. Pt should not be out until March 3rd.     Copied from CRM [id number]. Topic: Clinical - Medication Refill >> Apr 25, 2024 12:51 PM Donna E wrote: Medication:  hydrALAZINE  (APRESOLINE ) 100 MG tablet  Has the patient contacted their pharmacy? No  This is the patient's preferred pharmacy:   [facility] [id number] - [place], [place] - [address] [place] [address] Phone: [phone] Fax: [phone]   Is this the correct pharmacy for this prescription? Yes If no, delete pharmacy and type the correct one.   Has the prescription been filled recently? Yes  Is the patient out of the medication? Yes  Has the patient been seen for an appointment in the last year OR does the patient have an upcoming appointment? Yes  Can we respond through MyChart? No  Agent: Please be advised that Rx refills may take up to 3 business days. We ask that you follow-up with your pharmacy. >> Apr 25, 2024  1:00 PM Donna E wrote: Patient will be out of medication in 2 days "

## 2024-04-27 ENCOUNTER — Telehealth: Payer: Self-pay | Admitting: Internal Medicine

## 2024-04-27 ENCOUNTER — Other Ambulatory Visit: Payer: Self-pay | Admitting: Medical

## 2024-04-27 MED ORDER — HYDRALAZINE HCL 100 MG PO TABS: 100.0000 mg | ORAL_TABLET | Freq: Three times a day (TID) | ORAL | 1 refills | Status: AC

## 2024-04-27 NOTE — Telephone Encounter (Signed)
 Original order was prescribed through hospital and was given a 3 month supply but insurance will only pay for 30 days at a time. I have sent it in for 2 month supply.    Copied from CRM #[id number]. Topic: Clinical - Prescription Issue >> Apr 27, 2024  9:33 AM Travis F wrote: Reason for CRM: Patient is calling in because he requested a refill for hydrALAZINE  (APRESOLINE ) 100 MG tablet [[id number]]. Patient says he did receive the meds in December but he was only given 90 tablets and he takes them 3x a day. Patient is requesting this medication be refilled because he did not receive 270 tablets, only 90 and he needs the medication. Please advise.

## 2024-05-08 ENCOUNTER — Encounter: Payer: Self-pay | Admitting: Internal Medicine

## 2024-05-09 ENCOUNTER — Ambulatory Visit: Payer: PRIVATE HEALTH INSURANCE | Admitting: Medical

## 2024-05-16 ENCOUNTER — Ambulatory Visit: Payer: PRIVATE HEALTH INSURANCE | Admitting: Medical
